# Patient Record
Sex: Female | Born: 1990 | Race: Black or African American | Hispanic: No | Marital: Single | Smoking: Current every day smoker
Health system: Southern US, Community
[De-identification: ages and names within clinical notes are randomized; demographics above are authoritative.]

## PROBLEM LIST (undated history)

## (undated) DIAGNOSIS — G44209 Tension-type headache, unspecified, not intractable: Secondary | ICD-10-CM

## (undated) DIAGNOSIS — J45909 Unspecified asthma, uncomplicated: Secondary | ICD-10-CM

## (undated) DIAGNOSIS — M797 Fibromyalgia: Secondary | ICD-10-CM

## (undated) DIAGNOSIS — IMO0002 Reserved for concepts with insufficient information to code with codable children: Secondary | ICD-10-CM

## (undated) DIAGNOSIS — G4700 Insomnia, unspecified: Secondary | ICD-10-CM

## (undated) DIAGNOSIS — M1990 Unspecified osteoarthritis, unspecified site: Secondary | ICD-10-CM

## (undated) HISTORY — PX: CHOLECYSTECTOMY: SHX55

## (undated) HISTORY — DX: Reserved for concepts with insufficient information to code with codable children: IMO0002

## (undated) HISTORY — DX: Unspecified asthma, uncomplicated: J45.909

---

## 2010-07-11 ENCOUNTER — Inpatient Hospital Stay (INDEPENDENT_AMBULATORY_CARE_PROVIDER_SITE_OTHER)
Admission: RE | Admit: 2010-07-11 | Discharge: 2010-07-11 | Disposition: A | Discharge status: Home / Self Care | Payer: PRIVATE HEALTH INSURANCE | Source: Ambulatory Visit | Attending: Family Medicine | Admitting: Family Medicine

## 2010-07-11 ENCOUNTER — Ambulatory Visit (INDEPENDENT_AMBULATORY_CARE_PROVIDER_SITE_OTHER): Payer: PRIVATE HEALTH INSURANCE

## 2010-07-11 DIAGNOSIS — S92919A Unspecified fracture of unspecified toe(s), initial encounter for closed fracture: Secondary | ICD-10-CM

## 2011-01-31 ENCOUNTER — Emergency Department (HOSPITAL_COMMUNITY)
Admission: EM | Admit: 2011-01-31 | Discharge: 2011-01-31 | Disposition: A | Discharge status: Home / Self Care | Payer: PRIVATE HEALTH INSURANCE | Attending: Emergency Medicine | Admitting: Emergency Medicine

## 2011-01-31 DIAGNOSIS — IMO0002 Reserved for concepts with insufficient information to code with codable children: Secondary | ICD-10-CM | POA: Insufficient documentation

## 2011-01-31 DIAGNOSIS — J45909 Unspecified asthma, uncomplicated: Secondary | ICD-10-CM | POA: Insufficient documentation

## 2011-01-31 DIAGNOSIS — M545 Low back pain, unspecified: Secondary | ICD-10-CM | POA: Insufficient documentation

## 2011-01-31 DIAGNOSIS — K219 Gastro-esophageal reflux disease without esophagitis: Secondary | ICD-10-CM | POA: Insufficient documentation

## 2011-01-31 DIAGNOSIS — M79609 Pain in unspecified limb: Secondary | ICD-10-CM | POA: Insufficient documentation

## 2011-12-25 ENCOUNTER — Emergency Department (INDEPENDENT_AMBULATORY_CARE_PROVIDER_SITE_OTHER)
Admission: EM | Admit: 2011-12-25 | Discharge: 2011-12-25 | Disposition: A | Discharge status: Home / Self Care | Payer: BC Managed Care – PPO | Source: Home / Self Care

## 2011-12-25 ENCOUNTER — Encounter (HOSPITAL_COMMUNITY): Payer: Self-pay | Admitting: *Deleted

## 2011-12-25 DIAGNOSIS — B359 Dermatophytosis, unspecified: Secondary | ICD-10-CM

## 2011-12-25 HISTORY — DX: Tension-type headache, unspecified, not intractable: G44.209

## 2011-12-25 HISTORY — DX: Insomnia, unspecified: G47.00

## 2011-12-25 MED ORDER — ITRACONAZOLE 200 MG PO TABS: 200.0000 mg | ORAL_TABLET | Freq: Every morning | ORAL | Status: DC

## 2011-12-25 NOTE — ED Notes (Signed)
Several areas of dry round rash history ring worm - per pt saw first few spots x 2 weeks ago using otc clotrimazole antifungal cream without relief

## 2011-12-25 NOTE — ED Provider Notes (Signed)
History     CSN: [id number]  Arrival date & time 12/25/11  1430   First MD Initiated Contact with Patient 12/25/11 1526      Chief Complaint  Patient presents with  . Rash   Patient is a 21 y.o. female presenting with rash. The history is provided by the patient.  Rash  This is a new problem. The current episode started more than 1 week ago.   21 yr old female presents with Ringworm infestation.  SHe thoguht that she caught them fairly early on her R and L breasts and they are now on her face as well, and on her buttcoks as well as on her arms.  She is not immunocompromised and has some areas in her hair and neck as well. She state sthis has happened ove rthe past 1-2 weeks after being in a swimming pool She is homosexual but is monogamous and uses protection  Past Medical History  Diagnosis Date  . Insomnia   . Tension headache     History reviewed. No pertinent past surgical history.  History reviewed. No pertinent family history.  History  Substance Use Topics  . Smoking status: Current Everyday Smoker    Types: Cigars  . Smokeless tobacco: Not on file  . Alcohol Use: Yes    OB History    Grav Para Term Preterm Abortions TAB SAB Ect Mult Living                  Review of Systems  Constitutional: Negative.   HENT: Negative.   Eyes: Negative.   Respiratory: Negative.   Cardiovascular: Negative.   Gastrointestinal: Negative.   Genitourinary: Negative.   Musculoskeletal: Negative.   Skin: Positive for rash.  Neurological: Negative.   Hematological: Negative.   Psychiatric/Behavioral: Negative.     Allergies  Amoxicillin  Home Medications   Current Outpatient Rx  Name Route Sig Dispense Refill  . CLOTRIMAZOLE 1 % EX CREA Topical Apply topically 2 (two) times daily.    Marland Kitchen GABAPENTIN 300 MG PO CAPS Oral Take 300 mg by mouth 2 (two) times daily at 10 AM and 5 PM.    . AMITRIPTYLINE HCL 10 MG PO TABS Oral Take 10 mg by mouth at bedtime.      BP 139/88   Pulse 86  Temp 98.5 F (36.9 C) (Oral)  Resp 18  SpO2 100%  LMP 12/13/2011  Physical Exam Obese AAF in NAd Various tenia over L face, adjacent to mouth, on L neck on top or L breast, on L arm and R torso and L upper thigh s1 s2 no m/r/g Motor 5/5   ED Course  Procedures (including critical care time)  Labs Reviewed - No data to display No results found.   No diagnosis found.    MDM  21 yr aaf with diffuse dermatophytosis Will start Itraconazole 200mg  qd x 14 daya.  Patient encouraged to take OTC benadryl to help with itching and to continue using Clotrimazole to face to aid resolution of these lesions  Pleas Koch, MD [facility] [phone]         Rhetta Mura, MD 12/25/11 2007

## 2012-10-15 ENCOUNTER — Emergency Department (HOSPITAL_COMMUNITY)
Admission: EM | Admit: 2012-10-15 | Discharge: 2012-10-15 | Disposition: A | Discharge status: Home / Self Care | Payer: Self-pay | Attending: Emergency Medicine | Admitting: Emergency Medicine

## 2012-10-15 ENCOUNTER — Encounter (HOSPITAL_COMMUNITY): Payer: Self-pay | Admitting: *Deleted

## 2012-10-15 DIAGNOSIS — Z87891 Personal history of nicotine dependence: Secondary | ICD-10-CM | POA: Insufficient documentation

## 2012-10-15 DIAGNOSIS — IMO0001 Reserved for inherently not codable concepts without codable children: Secondary | ICD-10-CM | POA: Insufficient documentation

## 2012-10-15 DIAGNOSIS — L02519 Cutaneous abscess of unspecified hand: Secondary | ICD-10-CM | POA: Insufficient documentation

## 2012-10-15 DIAGNOSIS — G44209 Tension-type headache, unspecified, not intractable: Secondary | ICD-10-CM | POA: Insufficient documentation

## 2012-10-15 DIAGNOSIS — L02512 Cutaneous abscess of left hand: Secondary | ICD-10-CM

## 2012-10-15 HISTORY — DX: Fibromyalgia: M79.7

## 2012-10-15 MED ORDER — LIDOCAINE-EPINEPHRINE-TETRACAINE (LET) SOLUTION
3.0000 mL | Freq: Once | NASAL | Status: AC
  Administered 2012-10-15: 3 mL via TOPICAL
  Filled 2012-10-15: qty 3

## 2012-10-15 MED ORDER — HYDROCODONE-ACETAMINOPHEN 5-325 MG PO TABS: 1.0000 | ORAL_TABLET | ORAL | Status: DC | PRN

## 2012-10-15 MED ORDER — CEPHALEXIN 500 MG PO CAPS: 500.0000 mg | ORAL_CAPSULE | Freq: Four times a day (QID) | ORAL | Status: DC

## 2012-10-15 MED ORDER — HYDROCODONE-ACETAMINOPHEN 5-325 MG PO TABS
1.0000 | ORAL_TABLET | Freq: Once | ORAL | Status: AC
  Administered 2012-10-15: 1 via ORAL
  Filled 2012-10-15: qty 1

## 2012-10-15 NOTE — ED Provider Notes (Signed)
History    This chart was scribed for non-physician practitioner, Arnoldo Hooker PA-C working with Glynn Octave, MD by Donne Anon, ED Scribe. This patient was seen in room TR05C/TR05C and the patient's care was started at 1854.   CSN: [id number]  Arrival date & time 10/15/12  1624   First MD Initiated Contact with Patient 10/15/12 1854      Chief Complaint  Patient presents with  . Abscess     The history is provided by the patient. No language interpreter was used.   HPI Comments: Nancy Dalton is a 22 y.o. female who presents to the Emergency Department complaining of 1 week of gradual onset, gradually worsening, constant, moderate abscess on her left hand. She reports associated swelling to her hand. She states she was seen by an Urgent Care facility last week that gave her diprolene .05% cream that has made the swelling worse. She though it was a spider bite but but did not see and insect. She has tried OTC pain medication with little relief. She denies fever or any other pain. She reports she is otherwise healthy.  Past Medical History  Diagnosis Date  . Insomnia   . Tension headache   . Fibromyalgia     History reviewed. No pertinent past surgical history.  No family history on file.  History  Substance Use Topics  . Smoking status: Former Smoker    Types: Cigars  . Smokeless tobacco: Not on file  . Alcohol Use: Yes     Review of Systems  Constitutional: Negative for fever.  HENT: Negative for neck pain.   Gastrointestinal: Negative for abdominal pain.  Musculoskeletal: Positive for joint swelling.  Skin: Positive for wound.    Allergies  Amoxicillin and Betamethasone  Home Medications   Current Outpatient Rx  Name  Route  Sig  Dispense  Refill  . betamethasone dipropionate (DIPROLENE) 0.05 % cream   Topical   Apply 1 application topically 2 (two) times daily.         Marland Kitchen ibuprofen (ADVIL,MOTRIN) 200 MG tablet   Oral   Take 1,000 mg by mouth  every 6 (six) hours as needed for pain.           BP 147/90  Pulse 88  Temp(Src) 98.1 F (36.7 C) (Oral)  Resp 18  SpO2 100%  Physical Exam  Nursing note and vitals reviewed. Constitutional: She is oriented to person, place, and time. She appears well-developed and well-nourished. No distress.  HENT:  Head: Normocephalic and atraumatic.  Eyes: EOM are normal.  Neck: Neck supple. No tracheal deviation present.  Cardiovascular: Normal rate.   Pulmonary/Chest: Effort normal. No respiratory distress.  Musculoskeletal: Normal range of motion.  Neurological: She is alert and oriented to person, place, and time.  Skin: Skin is warm and dry.  Left hand swollen dorsally with greatest swelling and redness just proximal to 5th MCP joint. FROM all joints of hand without difficulty. Finger soft to palpation - low suspicion for tenosynovitis. No redness extending proximally.  Psychiatric: She has a normal mood and affect. Her behavior is normal.    ED Course  Procedures (including critical care time) DIAGNOSTIC STUDIES: Oxygen Saturation is 100% on RA, normal by my interpretation.    COORDINATION OF CARE: 8:47 PM Discussed treatment plan which includes I&D and antibiotics with pt at bedside and pt agreed to plan. Advised pt to be rechecked in 2 days.  INCISION AND DRAINAGE Performed by: Arnoldo Hooker PA-C Consent: Verbal  consent obtained. Risks and benefits: risks, benefits and alternatives were discussed Type: abscess  Body area: dorsum of left hand proximal to 5th MCP joint  Anesthesia: local infiltration  Incision was made with a scalpel.  Local anesthetic: lidocaine 2% without epinephrine  Anesthetic total: 1 ml  Complexity: complex Blunt dissection to break up loculations  Drainage: moderate sebaceous material and puss  Drainage amount: moderate  Packing material: no packing used  Patient tolerance: Patient tolerated the procedure well with no immediate  complications.     Labs Reviewed - No data to display No results found.   No diagnosis found.  1. Cutaneous abscess  MDM  Uncomplicated hand abscess.     I personally performed the services described in this documentation, which was scribed in my presence. The recorded information has been reviewed and is accurate.       Arnoldo Hooker, PA-C 10/20/12 1337

## 2012-10-15 NOTE — ED Notes (Signed)
Pt is here with left hand abscess and has swelling.  Pt states this has been going on since last Tuesday and she put a cream on it and now it is worse than before

## 2012-10-17 ENCOUNTER — Emergency Department (HOSPITAL_COMMUNITY)
Admission: EM | Admit: 2012-10-17 | Discharge: 2012-10-17 | Disposition: A | Discharge status: Home / Self Care | Payer: BC Managed Care – PPO | Attending: Emergency Medicine | Admitting: Emergency Medicine

## 2012-10-17 ENCOUNTER — Encounter (HOSPITAL_COMMUNITY): Payer: Self-pay

## 2012-10-17 DIAGNOSIS — Z888 Allergy status to other drugs, medicaments and biological substances status: Secondary | ICD-10-CM | POA: Insufficient documentation

## 2012-10-17 DIAGNOSIS — Z87891 Personal history of nicotine dependence: Secondary | ICD-10-CM | POA: Insufficient documentation

## 2012-10-17 DIAGNOSIS — IMO0001 Reserved for inherently not codable concepts without codable children: Secondary | ICD-10-CM | POA: Insufficient documentation

## 2012-10-17 DIAGNOSIS — Z5189 Encounter for other specified aftercare: Secondary | ICD-10-CM

## 2012-10-17 DIAGNOSIS — Z882 Allergy status to sulfonamides status: Secondary | ICD-10-CM | POA: Insufficient documentation

## 2012-10-17 DIAGNOSIS — G4700 Insomnia, unspecified: Secondary | ICD-10-CM | POA: Insufficient documentation

## 2012-10-17 DIAGNOSIS — Z8679 Personal history of other diseases of the circulatory system: Secondary | ICD-10-CM | POA: Insufficient documentation

## 2012-10-17 DIAGNOSIS — Z4801 Encounter for change or removal of surgical wound dressing: Secondary | ICD-10-CM | POA: Insufficient documentation

## 2012-10-17 NOTE — Discharge Instructions (Signed)
Continue to take antibiotics.  Follow up next week for wound recheck after antibiotics are complete, sooner if symptoms worsening.

## 2012-10-17 NOTE — ED Notes (Signed)
Pt presents to ED to have an abscess that was drained here 2 days ago rechecked. Abscess is located on left hand. Small amount of drainage, pt denies pain at site.

## 2012-10-17 NOTE — ED Provider Notes (Signed)
History  This chart was scribed for non-physician practitioner, Hector Shade, working with Dione Booze, MD by Ardeen Jourdain, ED Scribe. This patient was seen in room TR04C/TR04C and the patient's care was started at 1529.  CSN: [id number]  Arrival date & time 10/17/12  1516   First MD Initiated Contact with Patient 10/17/12 1529      Chief Complaint  Patient presents with  . Follow-up     The history is provided by the patient. No language interpreter was used.    HPI Comments: Nancy Dalton is a 22 y.o. female who presents to the Emergency Department complaining of an abscess recheck. Pt states she was evaluated here at [facility] 2 days ago for an abscess on her left hand. She received an I&D as treatment for the abscess. She reports being prescribed Keflex and is taking it as prescribed. Pt denies any nausea, emesis, diarrhea, fever and pain at the site as associated symptoms. She reports using ice to treat the area. She denies using warm water compresses to treat the area.    Past Medical History  Diagnosis Date  . Insomnia   . Tension headache   . Fibromyalgia     History reviewed. No pertinent past surgical history.  No family history on file.  History  Substance Use Topics  . Smoking status: Former Smoker    Types: Cigars  . Smokeless tobacco: Not on file  . Alcohol Use: Yes   No OB history available.   Review of Systems  Skin: Positive for wound.       Abscess recheck  All other systems reviewed and are negative.    Allergies  Amoxicillin and Betamethasone  Home Medications   Current Outpatient Rx  Name  Route  Sig  Dispense  Refill  . cephALEXin (KEFLEX) 500 MG capsule   Oral   Take 1 capsule (500 mg total) by mouth 4 (four) times daily.   20 capsule   0   . HYDROcodone-acetaminophen (NORCO/VICODIN) 5-325 MG per tablet   Oral   Take 1-2 tablets by mouth every 4 (four) hours as needed for pain.   12 tablet   0   . ibuprofen  (ADVIL,MOTRIN) 200 MG tablet   Oral   Take 1,000 mg by mouth every 6 (six) hours as needed for pain.           Triage Vitals: BP 133/71  Pulse 92  Temp(Src) 98.1 F (36.7 C) (Oral)  SpO2 99%  LMP 10/13/2012  Physical Exam  Nursing note and vitals reviewed. Constitutional: She is oriented to person, place, and time. She appears well-developed and well-nourished. No distress.  HENT:  Head: Normocephalic and atraumatic.  Eyes: EOM are normal. Pupils are equal, round, and reactive to light.  Neck: Normal range of motion. Neck supple. No tracheal deviation present.  Cardiovascular: Normal rate, regular rhythm and normal heart sounds.  Exam reveals no gallop and no friction rub.   No murmur heard. Pulmonary/Chest: Effort normal and breath sounds normal. No respiratory distress. She has no wheezes. She has no rales. She exhibits no tenderness.  Abdominal: Soft. Bowel sounds are normal. She exhibits no distension. There is no tenderness.  Musculoskeletal: Normal range of motion. She exhibits no edema.  Neurological: She is alert and oriented to person, place, and time.  Skin: Skin is warm and dry. She is not diaphoretic.  Well healing open abscess to left hand with moderate amount serosanguineous drainage. Mild erythema. No warmth or red streaking.  Mild tenderness to area  Psychiatric: She has a normal mood and affect. Her behavior is normal.    ED Course  Procedures (including critical care time)  DIAGNOSTIC STUDIES: Oxygen Saturation is 99% on room air, normal by my interpretation.    COORDINATION OF CARE:  3:45 PM-Discussed treatment plan which includes instructions for home care with pt at bedside and pt agreed to plan.    Labs Reviewed - No data to display No results found.   1. Wound check, abscess       MDM  Pt being treated for abscess on left dorsum of hand, taking keflex presented for recheck.  Abscess appears to be healing well, still open and draining.  Mild  erythema, no warmth or red streaking.    Rx: advised pt to continue taking antibiotics as prescribed.  May take tylenol and ibuprofen for pain.  Use warm compresses several times a day to help abscess continue to drain.  Pt verbalized understanding and agreement with tx plan.  F/u in 2-3 days for recheck with PCP, urgent care, or ED if needed.  Vitals: unremarkable. Discharged in stable condition.     I personally performed the services described in this documentation, which was scribed in my presence. The recorded information has been reviewed and is accurate.   Junius Finner, PA-C 10/18/12 1533

## 2012-10-19 NOTE — ED Provider Notes (Signed)
Medical screening examination/treatment/procedure(s) were performed by non-physician practitioner and as supervising physician I was immediately available for consultation/collaboration.  Dione Booze, MD 10/19/12 [phone]

## 2012-10-20 NOTE — ED Provider Notes (Signed)
Medical screening examination/treatment/procedure(s) were performed by non-physician practitioner and as supervising physician I was immediately available for consultation/collaboration.    Glynn Octave, MD 10/20/12 [phone]

## 2013-01-17 ENCOUNTER — Emergency Department (HOSPITAL_COMMUNITY)
Admission: EM | Admit: 2013-01-17 | Discharge: 2013-01-17 | Disposition: A | Discharge status: Home / Self Care | Payer: Managed Care, Other (non HMO) | Attending: Emergency Medicine | Admitting: Emergency Medicine

## 2013-01-17 ENCOUNTER — Encounter (HOSPITAL_COMMUNITY): Payer: Self-pay | Admitting: Emergency Medicine

## 2013-01-17 DIAGNOSIS — Z8739 Personal history of other diseases of the musculoskeletal system and connective tissue: Secondary | ICD-10-CM | POA: Insufficient documentation

## 2013-01-17 DIAGNOSIS — Z8659 Personal history of other mental and behavioral disorders: Secondary | ICD-10-CM | POA: Insufficient documentation

## 2013-01-17 DIAGNOSIS — IMO0002 Reserved for concepts with insufficient information to code with codable children: Secondary | ICD-10-CM | POA: Insufficient documentation

## 2013-01-17 DIAGNOSIS — Z87891 Personal history of nicotine dependence: Secondary | ICD-10-CM | POA: Insufficient documentation

## 2013-01-17 DIAGNOSIS — L0291 Cutaneous abscess, unspecified: Secondary | ICD-10-CM

## 2013-01-17 NOTE — ED Provider Notes (Signed)
Medical screening examination/treatment/procedure(s) were performed by non-physician practitioner and as supervising physician I was immediately available for consultation/collaboration.   William Alucard Fearnow, MD 01/17/13 2052 

## 2013-01-17 NOTE — ED Notes (Signed)
Pt from home reports that she has had "large bumps" in her L armpit x 2 weeks. Pt reports that they "went away" and then came back. Pt denies N/V. Pt A&O and in NAD

## 2013-01-17 NOTE — ED Provider Notes (Signed)
CSN: [id number]     Arrival date & time 01/17/13  1758 History  This chart was scribed for non-physician practitioner, Johnnette Gourd, PA-C, working with Dagmar Hait, MD by Ronal Fear, ED scribe. This patient was seen in room WTR6/WTR6 and the patient's care was started at 6:24 PM.    Chief Complaint  Patient presents with  . Abscess    The history is provided by the patient. No language interpreter was used.    HPI Comments: Nancy Dalton is a 22 y.o. female who presents to the Emergency Department complaining of abcesses 2 weeks ago on the left axilla. Pt states the abscesses resolved on their own but returned 5 days ago due to stress which makes the abcesess worse. Pt states they are tender to touch with a pain rate of 2/10. Pt denies fever, chills, or discharge from the area. She has not tried any alleviating factors.    Past Medical History  Diagnosis Date  . Insomnia   . Tension headache   . Fibromyalgia    History reviewed. No pertinent past surgical history. No family history on file. History  Substance Use Topics  . Smoking status: Former Smoker    Types: Cigars  . Smokeless tobacco: Not on file  . Alcohol Use: Yes   OB History   Grav Para Term Preterm Abortions TAB SAB Ect Mult Living                 Review of Systems  Constitutional: Negative for fever and chills.  Skin:       abscess  All other systems reviewed and are negative.    Allergies  Amoxicillin; Betamethasone; and Fluocinonide  Home Medications  No current outpatient prescriptions on file. Triage Vitals: BP 133/79  Pulse 113  Temp(Src) 99.4 F (37.4 C) (Oral)  Resp 20  SpO2 100%  LMP 01/06/2013 Physical Exam  Nursing note and vitals reviewed. Constitutional: She is oriented to person, place, and time. She appears well-developed and well-nourished. No distress.  HENT:  Head: Normocephalic and atraumatic.  Mouth/Throat: Oropharynx is clear and moist.  Eyes: Conjunctivae and  EOM are normal.  Neck: Normal range of motion. Neck supple.  Cardiovascular: Normal rate, regular rhythm and normal heart sounds.   Pulmonary/Chest: Effort normal and breath sounds normal. No respiratory distress.  Musculoskeletal: Normal range of motion. She exhibits no edema.  Neurological: She is alert and oriented to person, place, and time. No sensory deficit.  Skin: Skin is warm and dry.  1 cm diameter area of induration. No fluctuance. No surrounding erythema, edema, or warmth. 5mm area of induration about 1 inch below.  No fluctuance. surrounding  erythema, edema or warmth.  Psychiatric: She has a normal mood and affect. Her behavior is normal.    ED Course  Procedures (including critical care time)  DIAGNOSTIC STUDIES: Oxygen Saturation is 100% on RA, normal by my interpretation.    COORDINATION OF CARE: 6:33 PM- Pt advised that abscess needs I&D but pt did not agree. She states she would like to wait to see if they go away on their own.      Labs Review Labs Reviewed - No data to display Imaging Review No results found.  MDM   1. Abscess    Patient with abscess without cellulitis underlying axilla. She refuses incision and drainage at this time. I advised her to use warm compresses. Close return precautions discussed including signs to look for for developing cellulitis. Patient states understanding of  plan and is agreeable.  I personally performed the services described in this documentation, which was scribed in my presence. The recorded information has been reviewed and is accurate.    Trevor Mace, PA-C 01/17/13 1850

## 2013-04-26 ENCOUNTER — Ambulatory Visit (INDEPENDENT_AMBULATORY_CARE_PROVIDER_SITE_OTHER): Payer: Managed Care, Other (non HMO) | Admitting: Family Medicine

## 2013-04-26 ENCOUNTER — Ambulatory Visit: Payer: Managed Care, Other (non HMO)

## 2013-04-26 ENCOUNTER — Other Ambulatory Visit: Payer: Self-pay | Admitting: Family Medicine

## 2013-04-26 VITALS — BP 112/84 | HR 130 | Temp 98.4°F | Resp 18 | Ht 62.75 in | Wt 232.0 lb

## 2013-04-26 DIAGNOSIS — R5381 Other malaise: Secondary | ICD-10-CM

## 2013-04-26 DIAGNOSIS — J45909 Unspecified asthma, uncomplicated: Secondary | ICD-10-CM

## 2013-04-26 DIAGNOSIS — R000 Tachycardia, unspecified: Secondary | ICD-10-CM

## 2013-04-26 DIAGNOSIS — M79609 Pain in unspecified limb: Secondary | ICD-10-CM

## 2013-04-26 DIAGNOSIS — R05 Cough: Secondary | ICD-10-CM

## 2013-04-26 DIAGNOSIS — M79641 Pain in right hand: Secondary | ICD-10-CM

## 2013-04-26 MED ORDER — ALBUTEROL SULFATE (2.5 MG/3ML) 0.083% IN NEBU
2.5000 mg | INHALATION_SOLUTION | Freq: Once | RESPIRATORY_TRACT | Status: AC
  Administered 2013-04-26: 2.5 mg via RESPIRATORY_TRACT

## 2013-04-26 MED ORDER — ALBUTEROL SULFATE HFA 108 (90 BASE) MCG/ACT IN AERS: 2.0000 | INHALATION_SPRAY | Freq: Four times a day (QID) | RESPIRATORY_TRACT | Status: AC | PRN

## 2013-04-26 MED ORDER — DOXYCYCLINE HYCLATE 100 MG PO TABS: 100.0000 mg | ORAL_TABLET | Freq: Two times a day (BID) | ORAL | Status: DC

## 2013-04-26 NOTE — Progress Notes (Signed)
[facility] [address] [place] [address] [phone]  Date:  04/26/2013   Name:  Nancy Dalton   DOB:  [birth year]   MRN:  [id number]  PCP:  No PCP Per Patient    Chief Complaint: Cough and URI   History of Present Illness:  Nancy Dalton is a 22 y.o. very pleasant female patient who presents with the following:  Here today with illness.  3 days ago she noted a stuffy nose and sinus congestion.   She also has a runny nose, sneezing, cough and wheezing.  Today the cough got worse, she noted that she was having more wheezing, difficulty breathing and chest pains.   The cough is non- productive. She is not sure if she might have had a fever.    She took some benadryl yesterday.  Today she took some allegra.  No antipyretics; she is not supposed to take these due to her history of headaches.  Per her neurologist she should only take pain medications that are rx to her.   She also notes a pain in her right hand and wrist for about one week.  She is not aware of any injury to her hand.  She is right handed  LMP 04/23/13.   She does have history of asthma.  She may have wheezing every week or so; she has this more with exercise but does not have an inhaler on hand right now.  Her friend who is with her states that her wheezing may be more significant than she is letting on; "you need to have an inhaler"   There are no active problems to display for this patient.   Past Medical History  Diagnosis Date  . Insomnia   . Tension headache   . Fibromyalgia   . Asthma   . Ulcer     No past surgical history on file.  History  Substance Use Topics  . Smoking status: Former Smoker    Types: Cigars  . Smokeless tobacco: Not on file  . Alcohol Use: Yes    Family History  Problem Relation Age of Onset  . Heart disease Mother   . Hypertension Father   . Cancer Sister     Allergies  Allergen Reactions  . Amoxicillin   . Betamethasone Swelling  .  Fluocinonide Rash    Medication list has been reviewed and updated.  No current outpatient prescriptions on file prior to visit.   No current facility-administered medications on file prior to visit.    Review of Systems:  As per HPI- otherwise negative.   Physical Examination: Filed Vitals:   04/26/13 1736  BP: 112/84  Pulse: 124  Temp: 98.4 F (36.9 C)  Resp: 18   Filed Vitals:   04/26/13 1736  Height: 5' 2.75" (1.594 m)  Weight: 232 lb (105.235 kg)   Body mass index is 41.42 kg/(m^2). Ideal Body Weight: Weight in (lb) to have BMI = 25: 139.7  GEN: WDWN, NAD, Non-toxic, A & O x 3, obese, pleasant HEENT: Atraumatic, Normocephalic. Neck supple. No masses, No LAD.  Bilateral TM wnl, oropharynx normal.  PEERL,EOMI.   Ears and Nose: No external deformity. CV: RRR, No M/G/R. No JVD. No thrill. No extra heart sounds. PULM: CTA B, no crackles, rhonchi. No retractions. No resp. distress. No accessory muscle use. Wheezing and somewhat poor air movement on initial exam ABD: S, NT, ND, +BS. No rebound. No HSM. EXTR: No c/c/e NEURO Normal gait.  PSYCH: Normally interactive. Conversant. Not depressed or anxious appearing.  Calm demeanor.  Right hand: she is slightly tender over the proximal 4th and 5th MCs.  No redness, swelling or heat.   Results for orders placed in visit on 04/26/13  POCT INFLUENZA A/B      Result Value Range   Influenza A, POC Negative     Influenza B, POC Negative     No history of DVT or PE.  No OCP, no hormones, no smoking.  No calf pain or swelling  Given albuterol neb:  This did help her sx remit.  She noted that her CP and tightness/ SOB were resolved. However her pulse remained at around 130 BPM.  She denied any distress or difficulty breathing at this point.   [facility] reading (PRIMARY) by  Dr. Patsy Lager. CXR: negative  Assessment and Plan: Cough - Plan: POCT Influenza A/B, albuterol (PROVENTIL) (2.5 MG/3ML) 0.083% nebulizer solution 2.5 mg,  albuterol (PROVENTIL HFA;VENTOLIN HFA) 108 (90 BASE) MCG/ACT inhaler, doxycycline (VIBRA-TABS) 100 MG tablet, DG Chest 2 View  Morbid obesity  Other malaise and fatigue  Tachycardia - Plan: D-dimer, quantitative, POCT CBC  Pain of right hand  Asthma  Nancy Dalton is here today with likely URI/ bronchitis causing exacerbation of her RAD.  Per her report she has been told that her pulse was fast in the past.  We discussed concern of possible PE causing her sx and tachycardia.  She is wiling to have a D dimer drawn today.  Await this result and start doxy and albuterol HFA in the meantime.    Called at 9:45 pm to let her know that her d dimer is negative.  She is relieved to hear this news. Plan to call and check on her in the next 1 or 2 days.     Results for orders placed in visit on 04/26/13  D-DIMER, QUANTITATIVE      Result Value Range   D-Dimer, Quant 0.39  0.00 - 0.48 ug/mL-FEU  POCT INFLUENZA A/B      Result Value Range   Influenza A, POC Negative     Influenza B, POC Negative       Signed Abbe Amsterdam, MD

## 2013-04-26 NOTE — Patient Instructions (Addendum)
Use the albuterol as needed and the doxycycline as directed.  If you are not feeling better in the next few days please let me know- Sooner if worse.   If you become short of breath and your symptoms do not resolve with your albuterol please seek care right away  I will give you a call with your D dimer result this evening

## 2013-04-28 ENCOUNTER — Telehealth: Payer: Self-pay | Admitting: Family Medicine

## 2013-04-28 NOTE — Telephone Encounter (Signed)
Called to check on her- she "feels so much better"

## 2013-04-29 ENCOUNTER — Telehealth: Payer: Self-pay

## 2013-04-29 DIAGNOSIS — R05 Cough: Secondary | ICD-10-CM

## 2013-04-29 NOTE — Telephone Encounter (Signed)
Patient is experiencing headaches possibly from Doxycycline. Could she have a different medication? [facility]  [phone]

## 2013-04-30 ENCOUNTER — Telehealth: Payer: Self-pay

## 2013-04-30 LAB — BASIC METABOLIC PANEL WITH GFR
CO2: 26 mEq/L (ref 19–32)
Glucose, Bld: 86 mg/dL (ref 70–99)
Potassium: 4.1 mEq/L (ref 3.5–5.3)
Sodium: 140 mEq/L (ref 135–145)

## 2013-04-30 MED ORDER — AZITHROMYCIN 250 MG PO TABS: ORAL_TABLET | ORAL | Status: DC

## 2013-04-30 NOTE — Telephone Encounter (Signed)
Message copied by Johnnette Litter on Wed Apr 30, 2013  2:58 PM ------      Message from: Abbe Amsterdam C      Created: Wed Apr 30, 2013  2:02 PM       Please add a BMP to the blood at [facility]/  Dx: cough ------

## 2013-04-30 NOTE — Telephone Encounter (Signed)
Unable to add. Only a D-Dimer was sent to [facility] and it's a blue top critical frozen

## 2013-04-30 NOTE — Telephone Encounter (Signed)
Please advise 

## 2013-04-30 NOTE — Telephone Encounter (Signed)
Called her back to get more information.  She has noted headache since she started her doxy.  She stopped taking this yesterday and her HA is now gone.   Will change to azithromycin Check BMP Otherwise her breathing is "so much better"

## 2013-04-30 NOTE — Telephone Encounter (Signed)
A SST was sent the other night with the D Dimer for [facility] to hold. Spoke with Annice Pih at [place]. BMP added.

## 2013-07-01 ENCOUNTER — Ambulatory Visit: Payer: Managed Care, Other (non HMO)

## 2014-05-22 HISTORY — PX: CHOLECYSTECTOMY: SHX55

## 2016-04-09 ENCOUNTER — Emergency Department (HOSPITAL_COMMUNITY)
Admission: EM | Admit: 2016-04-09 | Discharge: 2016-04-10 | Disposition: A | Discharge status: Home / Self Care | Payer: Self-pay | Attending: Emergency Medicine | Admitting: Emergency Medicine

## 2016-04-09 ENCOUNTER — Encounter (HOSPITAL_COMMUNITY): Payer: Self-pay | Admitting: Emergency Medicine

## 2016-04-09 ENCOUNTER — Emergency Department (HOSPITAL_COMMUNITY): Payer: Self-pay

## 2016-04-09 DIAGNOSIS — Z87891 Personal history of nicotine dependence: Secondary | ICD-10-CM | POA: Insufficient documentation

## 2016-04-09 DIAGNOSIS — R102 Pelvic and perineal pain unspecified side: Secondary | ICD-10-CM

## 2016-04-09 DIAGNOSIS — N730 Acute parametritis and pelvic cellulitis: Secondary | ICD-10-CM

## 2016-04-09 DIAGNOSIS — N739 Female pelvic inflammatory disease, unspecified: Secondary | ICD-10-CM | POA: Insufficient documentation

## 2016-04-09 DIAGNOSIS — Z79899 Other long term (current) drug therapy: Secondary | ICD-10-CM | POA: Insufficient documentation

## 2016-04-09 DIAGNOSIS — N921 Excessive and frequent menstruation with irregular cycle: Secondary | ICD-10-CM

## 2016-04-09 DIAGNOSIS — J45909 Unspecified asthma, uncomplicated: Secondary | ICD-10-CM | POA: Insufficient documentation

## 2016-04-09 DIAGNOSIS — N946 Dysmenorrhea, unspecified: Secondary | ICD-10-CM

## 2016-04-09 LAB — WET PREP, GENITAL
Clue Cells Wet Prep HPF POC: NONE SEEN
Sperm: NONE SEEN
Trich, Wet Prep: NONE SEEN
Yeast Wet Prep HPF POC: NONE SEEN

## 2016-04-09 LAB — URINE MICROSCOPIC-ADD ON

## 2016-04-09 LAB — BASIC METABOLIC PANEL
Anion gap: 8 (ref 5–15)
BUN: 7 mg/dL (ref 6–20)
CO2: 22 mmol/L (ref 22–32)
Calcium: 8.9 mg/dL (ref 8.9–10.3)
Chloride: 106 mmol/L (ref 101–111)
Creatinine, Ser: 0.77 mg/dL (ref 0.44–1.00)
GFR calc Af Amer: >60 mL/min (ref >60)
GFR calc non Af Amer: >60 mL/min (ref >60)
Glucose, Bld: 79 mg/dL (ref 65–99)
Potassium: 3.5 mmol/L (ref 3.5–5.1)
Sodium: 136 mmol/L (ref 135–145)

## 2016-04-09 LAB — URINALYSIS, ROUTINE W REFLEX MICROSCOPIC
Bilirubin Urine: NEGATIVE
Glucose, UA: NEGATIVE mg/dL
Ketones, ur: NEGATIVE mg/dL
Leukocytes, UA: NEGATIVE
Nitrite: NEGATIVE
Protein, ur: NEGATIVE mg/dL
Specific Gravity, Urine: 1.024 (ref 1.005–1.030)
pH: 5.5 (ref 5.0–8.0)

## 2016-04-09 LAB — CBC
HCT: 38.4 % (ref 36.0–46.0)
Hemoglobin: 12.9 g/dL (ref 12.0–15.0)
MCH: 29.1 pg (ref 26.0–34.0)
MCHC: 33.6 g/dL (ref 30.0–36.0)
MCV: 86.5 fL (ref 78.0–100.0)
Platelets: 298 10*3/uL (ref 150–400)
RBC: 4.44 MIL/uL (ref 3.87–5.11)
RDW: 12.6 % (ref 11.5–15.5)
WBC: 6.2 10*3/uL (ref 4.0–10.5)

## 2016-04-09 LAB — PREGNANCY, URINE: Preg Test, Ur: NEGATIVE

## 2016-04-09 MED ORDER — KETOROLAC TROMETHAMINE 60 MG/2ML IM SOLN
60.0000 mg | Freq: Once | INTRAMUSCULAR | Status: AC
  Administered 2016-04-09: 60 mg via INTRAMUSCULAR
  Filled 2016-04-09: qty 2

## 2016-04-09 MED ORDER — KETOROLAC TROMETHAMINE 30 MG/ML IJ SOLN: 30.0000 mg | Freq: Once | INTRAMUSCULAR | Status: DC

## 2016-04-09 MED ORDER — OXYCODONE-ACETAMINOPHEN 5-325 MG PO TABS
2.0000 | ORAL_TABLET | Freq: Once | ORAL | Status: AC
  Administered 2016-04-09: 2 via ORAL
  Filled 2016-04-09: qty 2

## 2016-04-09 MED ORDER — HYDROMORPHONE HCL 1 MG/ML IJ SOLN
1.0000 mg | Freq: Once | INTRAMUSCULAR | Status: AC
Start: 2016-04-09 — End: 2016-04-09
  Administered 2016-04-09: 1 mg via INTRAVENOUS
  Filled 2016-04-09: qty 1

## 2016-04-09 MED ORDER — ONDANSETRON HCL 4 MG/2ML IJ SOLN
4.0000 mg | Freq: Once | INTRAMUSCULAR | Status: AC
  Administered 2016-04-09: 4 mg via INTRAVENOUS
  Filled 2016-04-09: qty 2

## 2016-04-09 NOTE — ED Triage Notes (Signed)
Pt reports having lower abd pain for the last few days. Pt reports having period 3 times in the last month all lasting 5-7 days. Pt states she has a history of ovarian cysts. Currently denies any different vaginal discharge at this time.

## 2016-04-09 NOTE — ED Provider Notes (Signed)
[facility]-EMERGENCY DEPT Provider Note   CSN: [id number] Arrival date & time: 04/09/16  1952 By signing my name below, I, Levon HedgerElizabeth Hall, attest that this documentation has been prepared under the direction and in the presence of non-physician practitioner, Antony MaduraKelly Hedaya Latendresse, PA-C Electronically Signed: Levon HedgerElizabeth Hall, Scribe. 04/09/2016. 8:24 PM.    History   Chief Complaint Chief Complaint  Patient presents with  . Abdominal Pain   HPI Nancy Dalton is a 25 y.o. female who presents to the Emergency Department complaining of constant, severe RLQ abdominal pain which began a few days ago. She describes her pain as sharp and pressure-like. Pain is exacerbated by sitting, standing or movement; she has used a heating pad with some relief. No OTC medications taken due to hx of tension headaches. She notes associated abnormal vaginal bleeding and nausea. Pt states she has had her period every two weeks for the last month, each time lasting 5-7 days and with a heavier flow. She is currently on her period. Pt has abdominal SHx of cholecystectomy. Pt reports one sexual partner; female.  No concern for STD. She is not currently taking birth control. Pt denies vomiting, dysuria, hematuria, hematochezia, or fever.   The history is provided by the patient. No language interpreter was used.   Past Medical History:  Diagnosis Date  . Asthma   . Fibromyalgia   . Insomnia   . Tension headache   . Ulcer [facility](HCC)     Patient Active Problem List   Diagnosis Date Noted  . Morbid obesity (HCC) 04/26/2013  . Asthma 04/26/2013    Past Surgical History:  Procedure Laterality Date  . CHOLECYSTECTOMY      OB History    No data available     Home Medications    Prior to Admission medications   Medication Sig Start Date End Date Taking? Authorizing Provider  albuterol (PROVENTIL HFA;VENTOLIN HFA) 108 (90 BASE) MCG/ACT inhaler Inhale 2 puffs into the lungs every 6 (six) hours as needed for wheezing or  shortness of breath. 04/26/13  Yes Gwenlyn FoundJessica C Copland, MD  hydrOXYzine (ATARAX/VISTARIL) 10 MG tablet Take 20 mg by mouth daily.   Yes Historical Provider, MD  doxycycline (VIBRAMYCIN) 100 MG capsule Take 1 capsule (100 mg total) by mouth 2 (two) times daily. 04/10/16   Antony MaduraKelly Tynleigh Birt, PA-C  naproxen (NAPROSYN) 500 MG tablet Take 1 tablet (500 mg total) by mouth 2 (two) times daily. 04/10/16   Antony MaduraKelly Verne Lanuza, PA-C  traMADol (ULTRAM) 50 MG tablet Take 1 tablet (50 mg total) by mouth every 6 (six) hours as needed. 04/10/16   Antony MaduraKelly Alfio Loescher, PA-C    Family History Family History  Problem Relation Age of Onset  . Heart disease Mother   . Hypertension Father   . Cancer Sister    Social History Social History  Substance Use Topics  . Smoking status: Former Smoker    Types: Cigars  . Smokeless tobacco: Never Used  . Alcohol use Yes    Allergies   Pineapple; Ativan [lorazepam]; Betamethasone; Amoxicillin; and Fluocinonide  Review of Systems Review of Systems 10 systems reviewed and all are negative for acute change except as noted in the HPI.   Physical Exam Updated Vital Signs BP 136/71 (BP Location: Left Arm)   Pulse 95   Temp 98 F (36.7 C) (Oral)   Resp 14   Ht 5\' 3"  (1.6 m)   Wt 102.1 kg   LMP 04/09/2016 Comment: negative urine pregnancy test 04/09/16  SpO2 100%  BMI 39.86 kg/m   Physical Exam  Constitutional: She is oriented to person, place, and time. She appears well-developed and well-nourished. No distress.  Nontoxic appearing and in no distress  HENT:  Head: Normocephalic and atraumatic.  Eyes: Conjunctivae and EOM are normal. No scleral icterus.  Neck: Normal range of motion.  Cardiovascular: Normal rate, regular rhythm and intact distal pulses.   Pulmonary/Chest: Effort normal. No respiratory distress. She has no wheezes. She has no rales.  Abdominal: Soft. She exhibits no distension and no mass. There is tenderness. There is no guarding.  Suprapubic tenderness to  palpation. No peritoneal signs. Abdomen soft and obese. No masses palpable.  Genitourinary: There is no rash, tenderness, lesion or injury on the right labia. There is no rash, tenderness, lesion or injury on the left labia. Uterus is tender (mild). Cervix exhibits no motion tenderness and no friability. Right adnexum displays tenderness. Right adnexum displays no mass. Left adnexum displays no mass and no tenderness. There is bleeding (scant) in the vagina. No signs of injury around the vagina. No vaginal discharge found.  Musculoskeletal: Normal range of motion.  Neurological: She is alert and oriented to person, place, and time. She exhibits normal muscle tone. Coordination normal.  Skin: Skin is warm and dry. No rash noted. She is not diaphoretic. No erythema. No pallor.  Psychiatric: She has a normal mood and affect. Her behavior is normal.  Nursing note and vitals reviewed.   ED Treatments / Results  DIAGNOSTIC STUDIES:  Oxygen Saturation is 100% on RA, normal by my interpretation.    COORDINATION OF CARE:  8:24 PM  Discussed treatment plan which includes pelvic exam with pt at bedside and pt agreed to plan.   Labs (all labs ordered are listed, but only abnormal results are displayed) Labs Reviewed  WET PREP, GENITAL - Abnormal; Notable for the following:       Result Value   WBC, Wet Prep HPF POC MANY (*)    All other components within normal limits  URINALYSIS, ROUTINE W REFLEX MICROSCOPIC (NOT AT [facility]) - Abnormal; Notable for the following:    Hgb urine dipstick TRACE (*)    All other components within normal limits  URINE MICROSCOPIC-ADD ON - Abnormal; Notable for the following:    Squamous Epithelial / LPF 0-5 (*)    Bacteria, UA RARE (*)    All other components within normal limits  CBC  BASIC METABOLIC PANEL  PREGNANCY, URINE  GC/CHLAMYDIA PROBE AMP ([place]) NOT AT [facility]    EKG  EKG Interpretation None      Radiology US Pelvis Complete  Result Date:  04/09/2016 CLINICAL DATA:  Pelvic pain for 3 days. Menometrorrhagia. Patient refused transvaginal imaging. EXAM: TRANSABDOMINAL ULTRASOUND OF PELVIS TECHNIQUE: Transabdominal ultrasound examination of the pelvis was performed including evaluation of the uterus, ovaries, adnexal regions, and pelvic cul-de-sac. COMPARISON:  CT abdomen and pelvis 03/23/2016 FINDINGS: Uterus Measurements: 7.7 x 3.7 x 4.7 cm. Uterus is slightly retroverted. No fibroids or other mass visualized. Endometrium Thickness: 6.2 mm.  No focal abnormality visualized. Right ovary Measurements: 2.6 x 2.1 x 2 cm. Normal appearance/no adnexal mass. Left ovary Measurements: 2.8 x 1.6 x 1.9 cm. Normal appearance/no adnexal mass. Other findings:  No abnormal free fluid. IMPRESSION: Normal ultrasound appearance of the uterus and ovaries. Electronically Signed   By: Burman Nieves M.D.   On: 04/09/2016 21:57   Ct Abdomen Pelvis W Contrast  Result Date: 04/10/2016 CLINICAL DATA:  Severe right lower quadrant  pain for a few days. Abnormal vaginal bleeding and nausea. White cell count 6.2. Negative urine pregnancy test. EXAM: CT ABDOMEN AND PELVIS WITH CONTRAST TECHNIQUE: Multidetector CT imaging of the abdomen and pelvis was performed using the standard protocol following bolus administration of intravenous contrast. CONTRAST:  ISOVUE-300 IOPAMIDOL (ISOVUE-300) INJECTION 61% COMPARISON:  03/23/2016 FINDINGS: Lower chest: Mild dependent changes in the lung bases. Hepatobiliary: Normal homogeneous appearance of the liver. Gallbladder is not visualized and could be surgically absent or contracted. No bile duct dilatation. Pancreas: Unremarkable. No pancreatic ductal dilatation or surrounding inflammatory changes. Spleen: Normal in size without focal abnormality. Adrenals/Urinary Tract: Adrenal glands are unremarkable. Kidneys are normal, without renal calculi, focal lesion, or hydronephrosis. Bladder is unremarkable. Stomach/Bowel: Stomach is  within normal limits. Appendix appears normal. No evidence of bowel wall thickening, distention, or inflammatory changes. Vascular/Lymphatic: No significant vascular findings are present. No enlarged abdominal or pelvic lymph nodes. Reproductive: Uterus and bilateral adnexa are unremarkable. Other: No abdominal wall hernia or abnormality. No abdominopelvic ascites. Musculoskeletal: No acute or significant osseous findings. IMPRESSION: No acute process demonstrated in the abdomen or pelvis. No evidence of bowel obstruction or inflammation. The appendix is normal. Electronically Signed   By: Burman Nieves M.D.   On: 04/10/2016 00:35    Procedures Procedures (including critical care time)  Medications Ordered in ED Medications  iopamidol (ISOVUE-300) 61 % injection (  Canceled Entry 04/10/16 0155)  oxyCODONE-acetaminophen (PERCOCET/ROXICET) 5-325 MG per tablet 2 tablet (2 tablets Oral Given 04/09/16 2108)  ketorolac (TORADOL) injection 60 mg (60 mg Intramuscular Given 04/09/16 2215)  ondansetron (ZOFRAN) injection 4 mg (4 mg Intravenous Given 04/09/16 2354)  HYDROmorphone (DILAUDID) injection 1 mg (1 mg Intravenous Given 04/09/16 2354)  iopamidol (ISOVUE-300) 61 % injection 100 mL (100 mLs Intravenous Contrast Given 04/10/16 0018)  diphenhydrAMINE (BENADRYL) injection 12.5 mg (12.5 mg Intravenous Given 04/10/16 0109)  cefTRIAXone (ROCEPHIN) injection 250 mg (250 mg Intramuscular Given 04/10/16 0153)  lidocaine (XYLOCAINE) 1 % (with pres) injection (20 mLs  Given 04/10/16 0154)     Initial Impression / Assessment and Plan / ED Course  I have reviewed the triage vital signs and the nursing notes.  Pertinent labs & imaging results that were available during my care of the patient were reviewed by me and considered in my medical decision making (see chart for details).  Clinical Course     25 year old female presents to the emergency department for evaluation of dysmenorrhea and  menometrorrhagia. Patient with tenderness to palpation in her suprapubic abdomen. Laboratory workup reassuring. Ultrasound imaging and CT scan also obtained. These are negative for acute findings. While patient has no fever, leukocytosis, or cervical motion tenderness, plan for treatment for pelvic inflammatory disease. Have also counseled on pain control with naproxen. Short course of tramadol given for severe pain. Patient referred to an OB/GYN for follow-up. I do not believe further emergent workup is indicated. Patient discharged in stable condition with no unaddressed concerns.   Final Clinical Impressions(s) / ED Diagnoses   Final diagnoses:  Dysmenorrhea  PID (acute pelvic inflammatory disease)    New Prescriptions Discharge Medication List as of 04/10/2016  1:18 AM    START taking these medications   Details  doxycycline (VIBRAMYCIN) 100 MG capsule Take 1 capsule (100 mg total) by mouth 2 (two) times daily., Starting Mon 04/10/2016, Print    naproxen (NAPROSYN) 500 MG tablet Take 1 tablet (500 mg total) by mouth 2 (two) times daily., Starting Mon 04/10/2016, Print  traMADol (ULTRAM) 50 MG tablet Take 1 tablet (50 mg total) by mouth every 6 (six) hours as needed., Starting Mon 04/10/2016, Print        I personally performed the services described in this documentation, which was scribed in my presence. The recorded information has been reviewed and is accurate.      Antony MaduraKelly Moriyah Byington, PA-C 04/10/16 [id number]    Shaune Pollackameron Isaacs, MD 04/10/16 [phone]

## 2016-04-10 ENCOUNTER — Encounter (HOSPITAL_COMMUNITY): Payer: Self-pay

## 2016-04-10 ENCOUNTER — Emergency Department (HOSPITAL_COMMUNITY): Payer: Self-pay

## 2016-04-10 LAB — GC/CHLAMYDIA PROBE AMP (~~LOC~~) NOT AT ARMC
CHLAMYDIA, DNA PROBE: NEGATIVE
NEISSERIA GONORRHEA: NEGATIVE

## 2016-04-10 MED ORDER — LIDOCAINE HCL 1 % IJ SOLN
INTRAMUSCULAR | Status: AC
  Administered 2016-04-10: 20 mL
  Filled 2016-04-10: qty 20

## 2016-04-10 MED ORDER — IOPAMIDOL (ISOVUE-300) INJECTION 61%
100.0000 mL | Freq: Once | INTRAVENOUS | Status: AC | PRN
  Administered 2016-04-10: 100 mL via INTRAVENOUS

## 2016-04-10 MED ORDER — CEFTRIAXONE SODIUM 250 MG IJ SOLR
250.0000 mg | Freq: Once | INTRAMUSCULAR | Status: AC
  Administered 2016-04-10: 250 mg via INTRAMUSCULAR
  Filled 2016-04-10: qty 250

## 2016-04-10 MED ORDER — DIPHENHYDRAMINE HCL 50 MG/ML IJ SOLN
12.5000 mg | Freq: Once | INTRAMUSCULAR | Status: AC
  Administered 2016-04-10: 12.5 mg via INTRAVENOUS
  Filled 2016-04-10: qty 1

## 2016-04-10 MED ORDER — DOXYCYCLINE HYCLATE 100 MG PO CAPS: 100.0000 mg | ORAL_CAPSULE | Freq: Two times a day (BID) | ORAL | 0 refills | Status: AC

## 2016-04-10 MED ORDER — NAPROXEN 500 MG PO TABS: 500.0000 mg | ORAL_TABLET | Freq: Two times a day (BID) | ORAL | 0 refills | Status: AC

## 2016-04-10 MED ORDER — TRAMADOL HCL 50 MG PO TABS: 50.0000 mg | ORAL_TABLET | Freq: Four times a day (QID) | ORAL | 0 refills | Status: AC | PRN

## 2016-04-10 MED ORDER — IOPAMIDOL (ISOVUE-300) INJECTION 61%
INTRAVENOUS | Status: AC
  Filled 2016-04-10: qty 100

## 2016-04-10 NOTE — Discharge Instructions (Signed)
You are being treated for pelvic inflammatory disease. Take doxycycline as prescribed until finished. Take naproxen as prescribed for pain. You may take tramadol for severe pain control. Follow-up with an OB/GYN for persistent symptoms. You may return for any new or concerning symptoms.

## 2016-04-26 ENCOUNTER — Encounter (HOSPITAL_COMMUNITY): Payer: Self-pay

## 2016-04-26 ENCOUNTER — Emergency Department (HOSPITAL_COMMUNITY)
Admission: EM | Admit: 2016-04-26 | Discharge: 2016-04-26 | Disposition: A | Discharge status: Home / Self Care | Payer: Managed Care, Other (non HMO) | Attending: Emergency Medicine | Admitting: Emergency Medicine

## 2016-04-26 ENCOUNTER — Emergency Department (HOSPITAL_COMMUNITY): Payer: Managed Care, Other (non HMO)

## 2016-04-26 DIAGNOSIS — F17290 Nicotine dependence, other tobacco product, uncomplicated: Secondary | ICD-10-CM | POA: Insufficient documentation

## 2016-04-26 DIAGNOSIS — Z79899 Other long term (current) drug therapy: Secondary | ICD-10-CM | POA: Insufficient documentation

## 2016-04-26 DIAGNOSIS — R1013 Epigastric pain: Secondary | ICD-10-CM | POA: Insufficient documentation

## 2016-04-26 DIAGNOSIS — R112 Nausea with vomiting, unspecified: Secondary | ICD-10-CM | POA: Insufficient documentation

## 2016-04-26 DIAGNOSIS — J45909 Unspecified asthma, uncomplicated: Secondary | ICD-10-CM | POA: Insufficient documentation

## 2016-04-26 LAB — URINALYSIS, ROUTINE W REFLEX MICROSCOPIC
BILIRUBIN URINE: NEGATIVE
GLUCOSE, UA: NEGATIVE mg/dL
HGB URINE DIPSTICK: NEGATIVE
Ketones, ur: 5 mg/dL — AB
Leukocytes, UA: NEGATIVE
Nitrite: NEGATIVE
PROTEIN: NEGATIVE mg/dL
Specific Gravity, Urine: 1.021 (ref 1.005–1.030)
pH: 5 (ref 5.0–8.0)

## 2016-04-26 LAB — COMPREHENSIVE METABOLIC PANEL
ALBUMIN: 4.6 g/dL (ref 3.5–5.0)
ALK PHOS: 81 U/L (ref 38–126)
ALT: 15 U/L (ref 14–54)
AST: 17 U/L (ref 15–41)
Anion gap: 9 (ref 5–15)
BILIRUBIN TOTAL: 0.8 mg/dL (ref 0.3–1.2)
BUN: 9 mg/dL (ref 6–20)
CALCIUM: 9.3 mg/dL (ref 8.9–10.3)
CO2: 23 mmol/L (ref 22–32)
CREATININE: 0.79 mg/dL (ref 0.44–1.00)
Chloride: 106 mmol/L (ref 101–111)
GFR calc Af Amer: >60 mL/min (ref >60)
GFR calc non Af Amer: >60 mL/min (ref >60)
GLUCOSE: 90 mg/dL (ref 65–99)
POTASSIUM: 3.8 mmol/L (ref 3.5–5.1)
Sodium: 138 mmol/L (ref 135–145)
TOTAL PROTEIN: 8 g/dL (ref 6.5–8.1)

## 2016-04-26 LAB — CBC
HEMATOCRIT: 39.8 % (ref 36.0–46.0)
HEMOGLOBIN: 13.9 g/dL (ref 12.0–15.0)
MCH: 29.1 pg (ref 26.0–34.0)
MCHC: 34.9 g/dL (ref 30.0–36.0)
MCV: 83.4 fL (ref 78.0–100.0)
Platelets: 304 10*3/uL (ref 150–400)
RBC: 4.77 MIL/uL (ref 3.87–5.11)
RDW: 12 % (ref 11.5–15.5)
WBC: 5.8 10*3/uL (ref 4.0–10.5)

## 2016-04-26 LAB — I-STAT BETA HCG BLOOD, ED (MC, WL, AP ONLY): I-stat hCG, quantitative: <5 m[IU]/mL (ref <5)

## 2016-04-26 LAB — I-STAT TROPONIN, ED: Troponin i, poc: 0 ng/mL (ref 0.00–0.08)

## 2016-04-26 LAB — LIPASE, BLOOD: Lipase: 25 U/L (ref 11–51)

## 2016-04-26 MED ORDER — ONDANSETRON HCL 4 MG/2ML IJ SOLN
4.0000 mg | Freq: Once | INTRAMUSCULAR | Status: AC
  Administered 2016-04-26: 4 mg via INTRAVENOUS
  Filled 2016-04-26: qty 2

## 2016-04-26 MED ORDER — FAMOTIDINE 20 MG PO TABS: 20.0000 mg | ORAL_TABLET | Freq: Two times a day (BID) | ORAL | 0 refills | Status: AC

## 2016-04-26 MED ORDER — SODIUM CHLORIDE 0.9 % IV BOLUS (SEPSIS)
1000.0000 mL | Freq: Once | INTRAVENOUS | Status: AC
  Administered 2016-04-26: 1000 mL via INTRAVENOUS

## 2016-04-26 MED ORDER — ONDANSETRON HCL 4 MG PO TABS: 4.0000 mg | ORAL_TABLET | Freq: Four times a day (QID) | ORAL | 0 refills | Status: AC

## 2016-04-26 MED ORDER — FAMOTIDINE IN NACL 20-0.9 MG/50ML-% IV SOLN
20.0000 mg | Freq: Once | INTRAVENOUS | Status: AC
  Administered 2016-04-26: 20 mg via INTRAVENOUS
  Filled 2016-04-26: qty 50

## 2016-04-26 MED ORDER — MORPHINE SULFATE (PF) 4 MG/ML IV SOLN
4.0000 mg | Freq: Once | INTRAVENOUS | Status: DC
  Filled 2016-04-26: qty 1

## 2016-04-26 MED ORDER — MORPHINE SULFATE (PF) 4 MG/ML IV SOLN
4.0000 mg | Freq: Once | INTRAVENOUS | Status: AC
  Administered 2016-04-26: 4 mg via INTRAVENOUS
  Filled 2016-04-26: qty 1

## 2016-04-26 NOTE — ED Triage Notes (Signed)
PT C/O UPPER ABDOMINAL PAIN WITH N/V/D OVER A WEEK. PT STS HER CHEST STARTED HURTING 2 DAYS AGO, WITH A DRY COUGH, WEAKNESS, AND SOB. PT ALSO STS CHILLS.

## 2016-04-26 NOTE — ED Notes (Signed)
EKG completed in triage.

## 2016-04-26 NOTE — ED Notes (Signed)
One attempted lab draw unsuccessful

## 2016-04-26 NOTE — ED Provider Notes (Signed)
[facility]-EMERGENCY DEPT Provider Note   CSN: [id number] Arrival date & time: 04/26/16  1434     History   Chief Complaint Chief Complaint  Patient presents with  . Abdominal Pain  . Chest Pain    HPI Nancy Dalton is a 25 y.o. female.  Pt presents to the ED today with epigastric and RUQ abdominal pain.  She staid that she has had n/v as well.  Pt has also had a cough and chest pain.  The pt denies any fevers, but she has felt hot.  Pt was here on 11/19 for the same.  She had an [place] and a CT scan that were normal.  Pt given naprosyn for her sx.  Gallbladder is gone.      Past Medical History:  Diagnosis Date  . Asthma   . Fibromyalgia   . Insomnia   . Tension headache   . Ulcer [facility])     Patient Active Problem List   Diagnosis Date Noted  . Morbid obesity (HCC) 04/26/2013  . Asthma 04/26/2013    Past Surgical History:  Procedure Laterality Date  . CHOLECYSTECTOMY      OB History    No data available       Home Medications    Prior to Admission medications   Medication Sig Start Date End Date Taking? Authorizing Provider  DM-Doxylamine-Acetaminophen (VICKS NYQUIL COLD & FLU) 15-6.25-325 MG CAPS Take 2 capsules by mouth at bedtime as needed (for cold and sleep).   Yes Historical Provider, MD  hydrOXYzine (ATARAX/VISTARIL) 10 MG tablet Take 20 mg by mouth daily.   Yes Historical Provider, MD  albuterol (PROVENTIL HFA;VENTOLIN HFA) 108 (90 BASE) MCG/ACT inhaler Inhale 2 puffs into the lungs every 6 (six) hours as needed for wheezing or shortness of breath. Patient not taking: Reported on 04/26/2016 04/26/13   Pearline Cables, MD  doxycycline (VIBRAMYCIN) 100 MG capsule Take 1 capsule (100 mg total) by mouth 2 (two) times daily. Patient not taking: Reported on 04/26/2016 04/10/16   Antony Madura, PA-C  famotidine (PEPCID) 20 MG tablet Take 1 tablet (20 mg total) by mouth 2 (two) times daily. 04/26/16   Jacalyn Lefevre, MD  naproxen (NAPROSYN) 500 MG tablet Take 1  tablet (500 mg total) by mouth 2 (two) times daily. Patient not taking: Reported on 04/26/2016 04/10/16   Antony Madura, PA-C  ondansetron (ZOFRAN) 4 MG tablet Take 1 tablet (4 mg total) by mouth every 6 (six) hours. 04/26/16   Jacalyn Lefevre, MD  traMADol (ULTRAM) 50 MG tablet Take 1 tablet (50 mg total) by mouth every 6 (six) hours as needed. Patient not taking: Reported on 04/26/2016 04/10/16   Antony Madura, PA-C    Family History Family History  Problem Relation Age of Onset  . Heart disease Mother   . Hypertension Father   . Cancer Sister     Social History Social History  Substance Use Topics  . Smoking status: Current Every Day Smoker    Packs/day: 0.50    Types: Cigars  . Smokeless tobacco: Never Used  . Alcohol use Yes     Allergies   Bee venom; Pineapple; Ativan [lorazepam]; Betamethasone; Amoxicillin; and Fluocinonide   Review of Systems Review of Systems  Respiratory: Positive for cough.   Cardiovascular: Positive for chest pain.  Gastrointestinal: Positive for abdominal pain, nausea and vomiting.  All other systems reviewed and are negative.    Physical Exam Updated Vital Signs BP 126/93 (BP Location: Left Arm)   Pulse  105   Temp 98.5 F (36.9 C)   Resp 18   Ht 5\' 3"  (1.6 m)   Wt 225 lb (102.1 kg)   LMP 04/09/2016 Comment: negative urine pregnancy test 04/09/16  SpO2 100%   BMI 39.86 kg/m   Physical Exam  Constitutional: She is oriented to person, place, and time. She appears well-developed and well-nourished.  HENT:  Head: Normocephalic and atraumatic.  Right Ear: External ear normal.  Left Ear: External ear normal.  Nose: Nose normal.  Mouth/Throat: Oropharynx is clear and moist.  Eyes: Conjunctivae and EOM are normal. Pupils are equal, round, and reactive to light.  Neck: Normal range of motion. Neck supple.  Cardiovascular: Regular rhythm, normal heart sounds and intact distal pulses.  Tachycardia present.   Pulmonary/Chest: Effort normal and  breath sounds normal.  Abdominal: Soft. Bowel sounds are normal. There is tenderness in the right upper quadrant and epigastric area.  Musculoskeletal: Normal range of motion.  Neurological: She is alert and oriented to person, place, and time.  Skin: Skin is warm.  Psychiatric: She has a normal mood and affect. Her behavior is normal. Thought content normal.  Nursing note and vitals reviewed.    ED Treatments / Results  Labs (all labs ordered are listed, but only abnormal results are displayed) Labs Reviewed  URINALYSIS, ROUTINE W REFLEX MICROSCOPIC - Abnormal; Notable for the following:       Result Value   APPearance HAZY (*)    Ketones, ur 5 (*)    All other components within normal limits  CBC  LIPASE, BLOOD  COMPREHENSIVE METABOLIC PANEL  I-STAT TROPOININ, ED  I-STAT BETA HCG BLOOD, ED ([facility], [facility], [facility] ONLY)    EKG  EKG Interpretation  Date/Time:  Wednesday April 26 2016 14:42:10 EST Ventricular Rate:  134 PR Interval:    QRS Duration: 77 QT Interval:  311 QTC Calculation: 465 R Axis:   64 Text Interpretation:  Sinus tachycardia Confirmed by Particia NearingHAVILAND MD, Agam Davenport ([id number]) on 04/26/2016 5:22:02 PM       Radiology Dg Chest 2 View  Result Date: 04/26/2016 CLINICAL DATA:  Cough.  Dizziness. EXAM: CHEST  2 VIEW COMPARISON:  04/26/2013 . FINDINGS: Mediastinum and hilar structures normal. Lungs are clear. No pleural effusion or pneumothorax. No acute bony abnormality. Chest is stable from prior study. IMPRESSION: No acute abnormality . Electronically Signed   By: Maisie Fushomas  Register   On: 04/26/2016 15:15    Procedures Procedures (including critical care time)  Medications Ordered in ED Medications  morphine 4 MG/ML injection 4 mg (4 mg Intravenous Not Given 04/26/16 2042)  sodium chloride 0.9 % bolus 1,000 mL (0 mLs Intravenous Stopped 04/26/16 1949)  ondansetron (ZOFRAN) injection 4 mg (4 mg Intravenous Given 04/26/16 1744)  morphine 4 MG/ML injection 4 mg (4 mg Intravenous  Given 04/26/16 1744)  famotidine (PEPCID) IVPB 20 mg premix (0 mg Intravenous Stopped 04/26/16 1831)  ondansetron (ZOFRAN) injection 4 mg (4 mg Intravenous Given 04/26/16 2042)     Initial Impression / Assessment and Plan / ED Course  I have reviewed the triage vital signs and the nursing notes.  Pertinent labs & imaging results that were available during my care of the patient were reviewed by me and considered in my medical decision making (see chart for details).  Clinical Course    Pt is feeling better.  She will be started on pepcid and zofran.  Final Clinical Impressions(s) / ED Diagnoses   Final diagnoses:  Epigastric pain  Non-intractable  vomiting with nausea, unspecified vomiting type    New Prescriptions Discharge Medication List as of 04/26/2016  6:45 PM    START taking these medications   Details  famotidine (PEPCID) 20 MG tablet Take 1 tablet (20 mg total) by mouth 2 (two) times daily., Starting Wed 04/26/2016, Print    ondansetron (ZOFRAN) 4 MG tablet Take 1 tablet (4 mg total) by mouth every 6 (six) hours., Starting Wed 04/26/2016, Print         Jacalyn LefevreJulie Taunya Goral, MD 04/26/16 2306

## 2016-07-11 ENCOUNTER — Other Ambulatory Visit: Payer: Self-pay | Admitting: Obstetrics

## 2016-07-12 ENCOUNTER — Ambulatory Visit
Admission: RE | Admit: 2016-07-12 | Discharge: 2016-07-12 | Disposition: A | Discharge status: Home / Self Care | Payer: Self-pay | Source: Ambulatory Visit | Attending: Obstetrics | Admitting: Obstetrics

## 2016-07-25 ENCOUNTER — Emergency Department
Admission: EM | Admit: 2016-07-25 | Discharge: 2016-07-25 | Disposition: A | Discharge status: Home / Self Care | Payer: Self-pay | Source: Ambulatory Visit | Attending: Emergency Medicine | Admitting: Emergency Medicine

## 2016-07-25 ENCOUNTER — Emergency Department: Payer: Self-pay

## 2016-07-25 DIAGNOSIS — S93402A Sprain of unspecified ligament of left ankle, initial encounter: Secondary | ICD-10-CM | POA: Insufficient documentation

## 2016-07-25 DIAGNOSIS — S99912A Unspecified injury of left ankle, initial encounter: Secondary | ICD-10-CM

## 2016-07-25 DIAGNOSIS — Y9389 Activity, other specified: Secondary | ICD-10-CM | POA: Insufficient documentation

## 2016-07-25 DIAGNOSIS — W109XXA Fall (on) (from) unspecified stairs and steps, initial encounter: Secondary | ICD-10-CM | POA: Insufficient documentation

## 2016-07-25 DIAGNOSIS — M25572 Pain in left ankle and joints of left foot: Secondary | ICD-10-CM

## 2016-07-25 DIAGNOSIS — Y9289 Other specified places as the place of occurrence of the external cause: Secondary | ICD-10-CM | POA: Insufficient documentation

## 2016-07-25 DIAGNOSIS — W108XXA Fall (on) (from) other stairs and steps, initial encounter: Secondary | ICD-10-CM

## 2016-07-25 DIAGNOSIS — Y998 Other external cause status: Secondary | ICD-10-CM | POA: Insufficient documentation

## 2016-07-25 MED ORDER — IBUPROFEN 800 MG PO TABS *I*
800.0000 mg | ORAL_TABLET | Freq: Once | ORAL | Status: DC
Start: 2016-07-25 — End: 2016-07-26
  Filled 2016-07-25: qty 1

## 2016-07-25 NOTE — ED Notes (Addendum)
Patient ambulatory for discharge. Vital signs stable and patient is safe for discharge.  Discharge teaching and follow up care reviewed. Patient verbalized understanding of discharge instructions. To follow up with provider. Proper clothing in place, crutches and ankle brace provided.

## 2016-07-25 NOTE — First Provider Contact (Signed)
ED First Provider Contact Note    Initial provider evaluation performed by   ED First Provider Contact     Date/Time Event User Comments    07/25/16 2116 ED First Provider Contact Necola Bluestein C Initial Face to Face Provider Contact        26 yo female presents with left ankle pain x 1 week after trip down a step. Now having pain radiating up entire left leg . Ambulatory. Works as a Financial trader.     Vital signs reviewed.    Orders placed:  XRAYS     Patient requires further evaluation.     Laurette Schimke Muriel Hannold, PA, 07/25/2016, 9:18 PM    Supervising physician Dr. Mellissa Kohut was immediately available     Deetta Perla, [place]  07/25/16 2119

## 2016-07-25 NOTE — ED Triage Notes (Signed)
Left ankle pain x one week after fall.        Triage Note   Hollace Hayward, RN

## 2016-07-25 NOTE — Discharge Instructions (Signed)
Ibuprofen 400 mg every 6 hr    Crutches as needed    Brace as needed    See your doctor for follow up If daily improvement not seen.

## 2016-07-25 NOTE — ED Provider Notes (Signed)
History     Chief Complaint   Patient presents with    Leg Pain     HPI Comments: Cc left ankle pain    Mis step  Inversion left ankle  Sig lat ankle pain  Sild prox tib pain  Very poor gait.   New to [place]  No local care    No past medical history on file.     No past surgical history on file.  No family history on file.    Social History    has no tobacco, alcohol, drug, and sexual activity history on file.    Living Situation     Questions Responses    Patient lives with     Homeless     Caregiver for other family member     External Services     Employment     Domestic Violence Risk           Problem List   There is no problem list on file for this patient.      Review of Systems   Review of Systems   Constitutional: Negative for activity change, diaphoresis and fever.   HENT: Negative for congestion.    Eyes: Negative for discharge.   Respiratory: Negative for cough.    Cardiovascular: Negative for chest pain.   Gastrointestinal: Negative for abdominal pain.   Musculoskeletal: Negative for gait problem (no new joint or limb issues).   Skin: Negative for color change and rash.   Neurological: Negative for headaches.   Psychiatric/Behavioral: Negative for agitation.       Physical Exam     ED Triage Vitals   BP Heart Rate Heart Rate (via Pulse Ox) Resp Temp Temp src SpO2 (Retired) O2 Device O2 Flow Rate   07/25/16 2136 07/25/16 2136 -- 07/25/16 2136 07/25/16 2136 07/25/16 2136 07/25/16 2136 -- --   139/73 103  18 37 C (98.6 F) TEMPORAL 100 %        Weight           07/25/16 2136           83.9 kg (185 lb)                    Physical Exam   Constitutional: She appears well-developed.   HENT:   Head: Normocephalic.   Eyes: Conjunctivae are normal.   Neck: Normal range of motion. Neck supple.   Pulmonary/Chest: No stridor.   Musculoskeletal: Normal range of motion.   Sig left lat ankle pain  Mod left prox tib pain  Ankle x ray nl  Pending tib/fib  crutch est  Ankle brace prn  Rest and NSAID at home.   See  ortho if not improving   Neurological: She is alert.   Skin: Skin is warm. She is not diaphoretic.   Psychiatric: She has a normal mood and affect. Her behavior is normal. Thought content normal.   Nursing note and vitals reviewed.      Medical Decision Making        Initial Evaluation:  ED First Provider Contact     Date/Time Event User Comments    07/25/16 2116 ED First Provider Contact RIESENBERGER, BRIAN C Initial Face to Face Provider Contact          Patient seen by me on arrival date of 07/25/2016 at at time of arrival  9:15 PM.  Initial face to face evaluation time noted above may be discrepant due to patient acuity  and delay in documentation.    Assessment:  26 y.o.female comes to the ED with *ankle pain**    Differential Diagnosis includes *Sig left lat ankle pain  Mod left prox tib pain  Ankle x ray nl  Pending tib/fib  crutch est  Ankle brace prn  Rest and NSAID at home.   See ortho if not improving**                      Plan: *see above **  Danie Binder, MD           Danie Binder, MD  07/25/16 2245

## 2017-09-16 IMAGING — CT CT ABD-PELV W/ CM
2 of 4 series · 16 of 46 positions shown, 18 images · IV contrast (ISOVUE)
Comparison: 03/23/2016

CLINICAL DATA: Severe right lower quadrant pain for a few days.
Abnormal vaginal bleeding and nausea. White cell count 6.2. Negative
urine pregnancy test.

EXAM:
CT ABDOMEN AND PELVIS WITH CONTRAST
TECHNIQUE: Multidetector CT imaging of the abdomen and pelvis was performed
using the standard protocol following bolus administration of
intravenous contrast.
CONTRAST:  100mL KRMI0C-733 IOPAMIDOL (KRMI0C-733) INJECTION 61%

[Series 2: abd/pel with · axial · 0.67mm/px · z∈[-208,+177]mm · 13 of 87 slices shown, 15 images]
[im 5/87  soft-tissue]
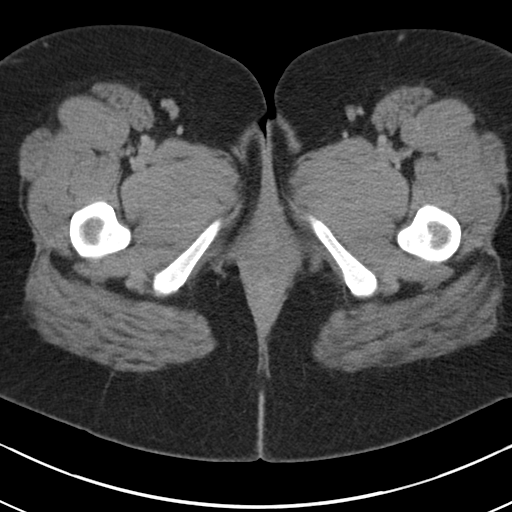
[im 5/87  bone]
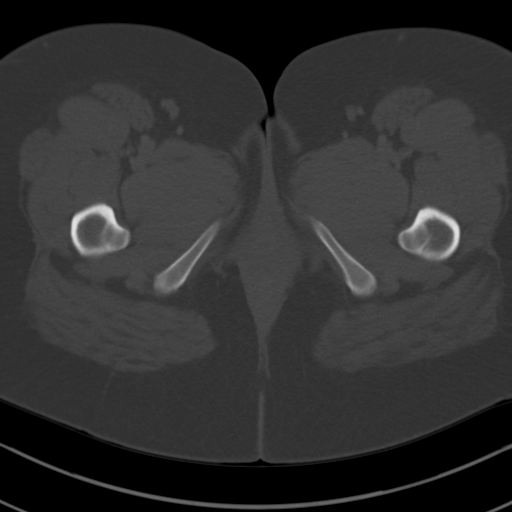
[im 13/87  soft-tissue]
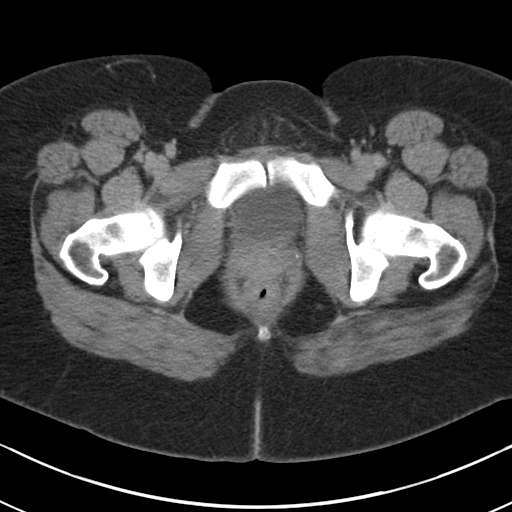
[im 17/87  soft-tissue]
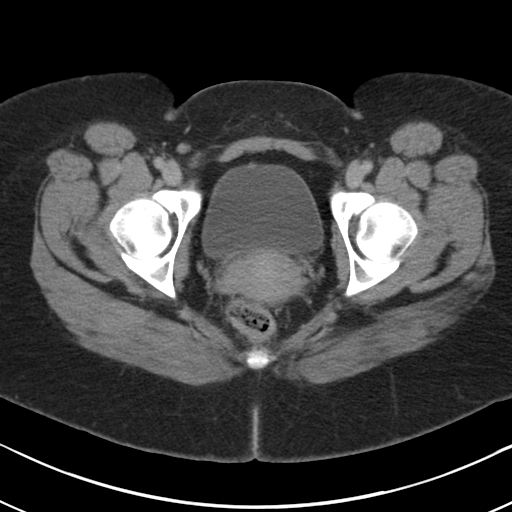
[im 25/87  soft-tissue]
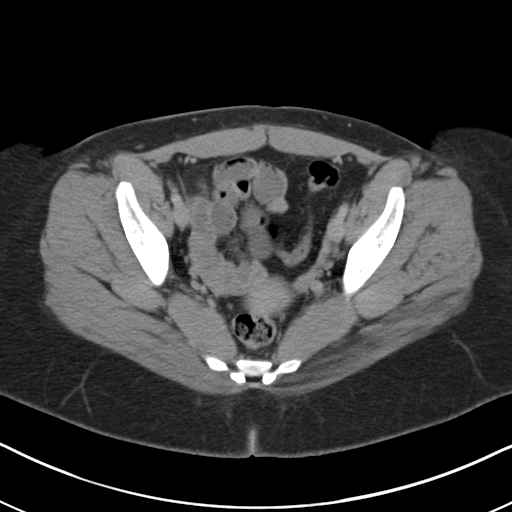
[im 29/87  soft-tissue]
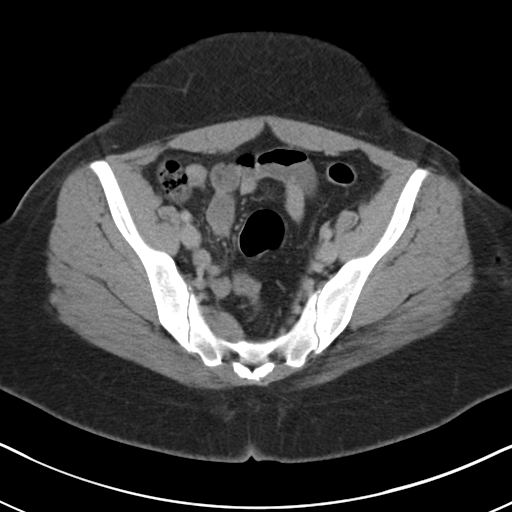
[im 37/87  soft-tissue]
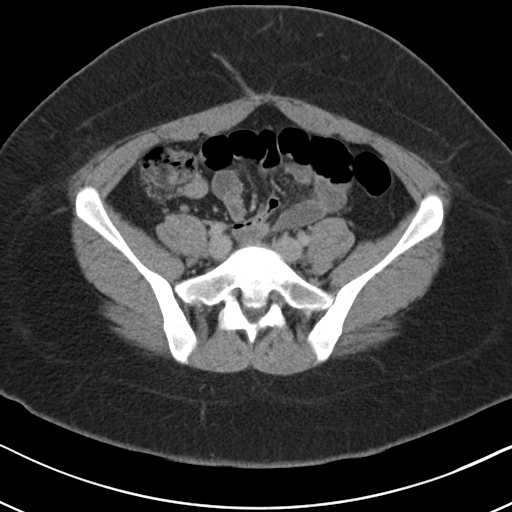
[im 46/87  soft-tissue]
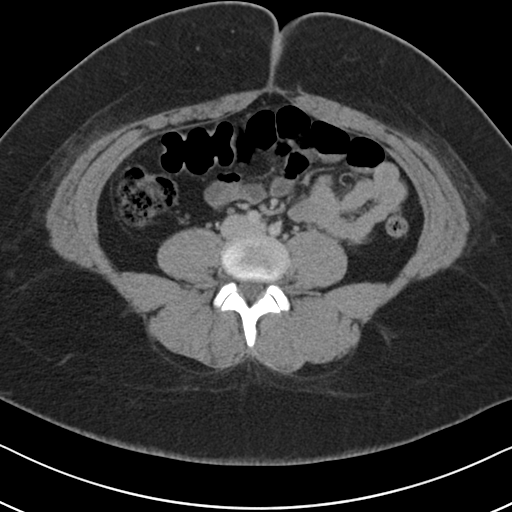
[im 50/87  soft-tissue]
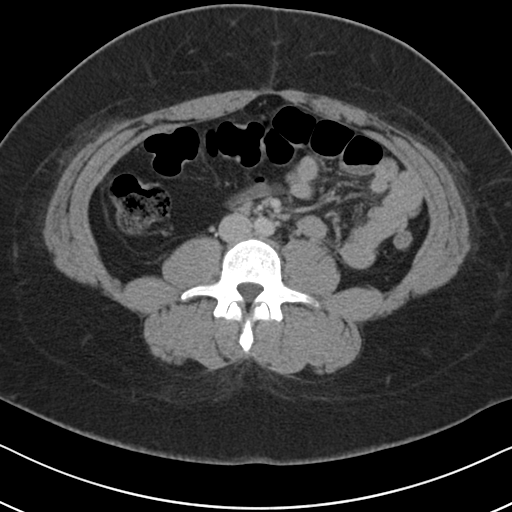
[im 58/87  soft-tissue]
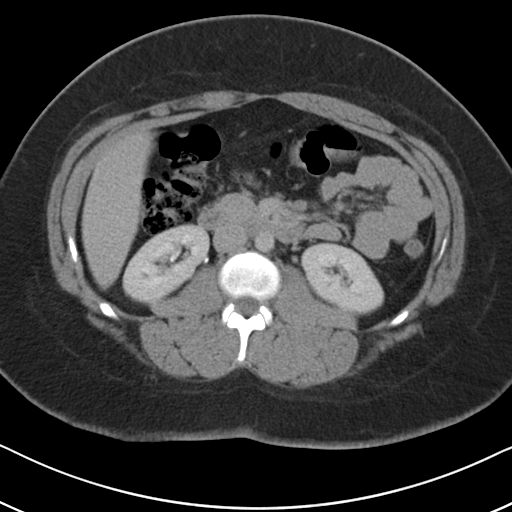
[im 58/87  bone]
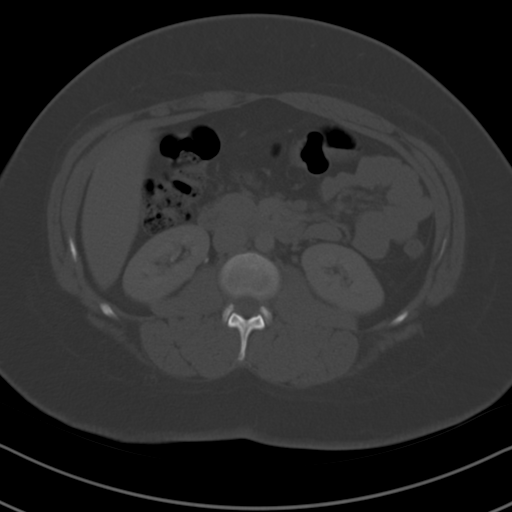
[im 62/87  soft-tissue]
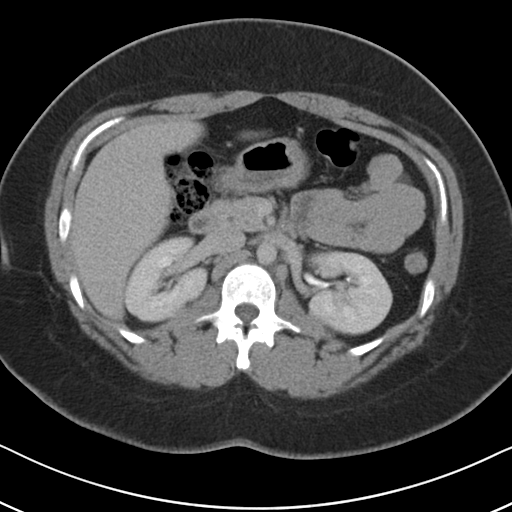
[im 70/87  soft-tissue]
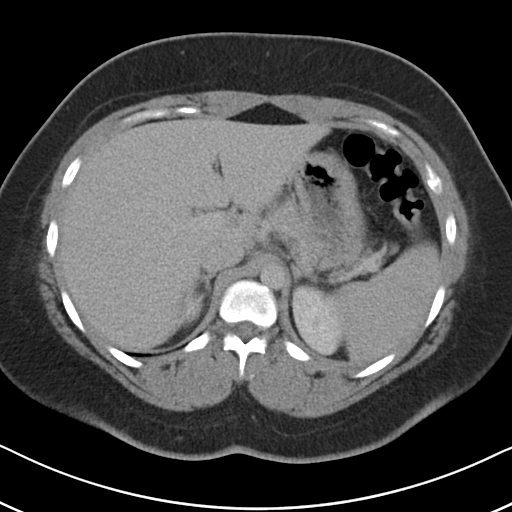
[im 74/87  soft-tissue]
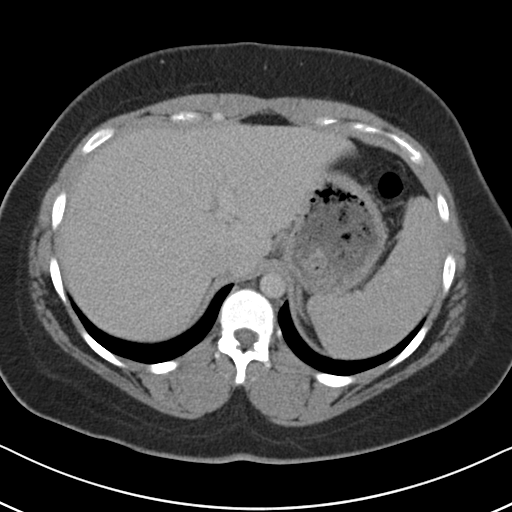
[im 82/87  soft-tissue]
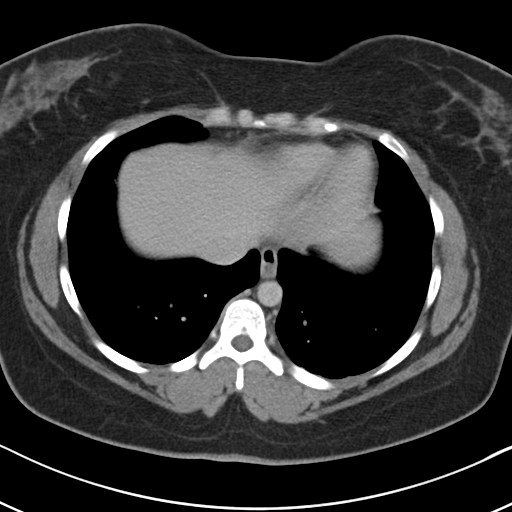

[Series 4: coronal a/|p · coronal · 0.77mm/px · 3 of 131 slices shown]
[im 44/131  soft-tissue]
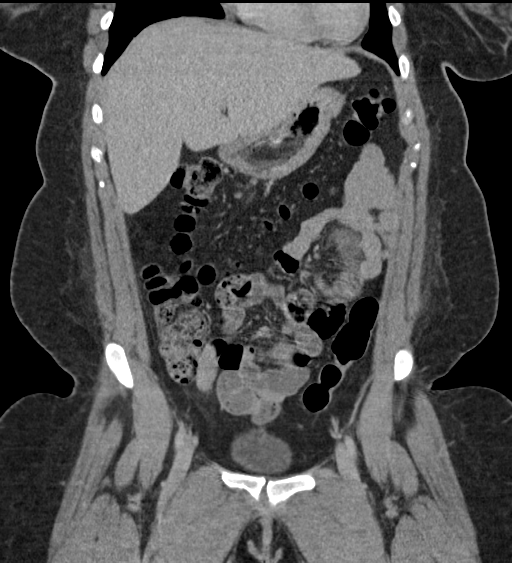
[im 58/131  soft-tissue]
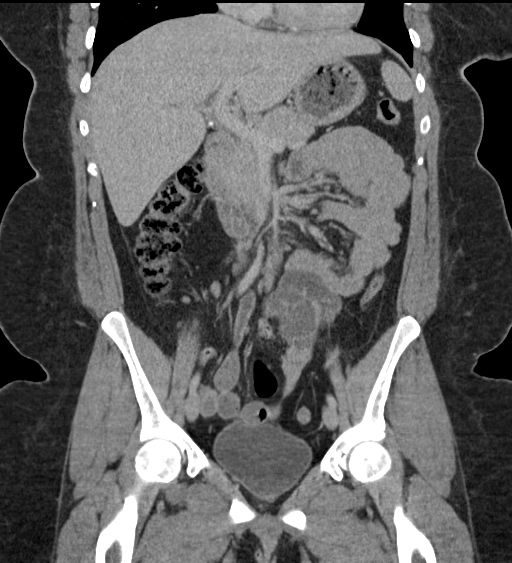
[im 73/131  soft-tissue]
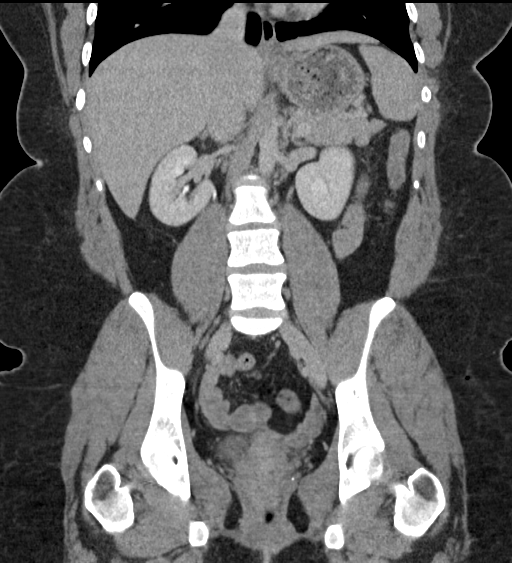

[16 of 46 positions shown; findings below may reference images not displayed]

FINDINGS: Lower chest: Mild dependent changes in the lung bases.

Hepatobiliary: Normal homogeneous appearance of the liver.
Gallbladder is not visualized and could be surgically absent or
contracted. No bile duct dilatation.

Pancreas: Unremarkable. No pancreatic ductal dilatation or
surrounding inflammatory changes.

Spleen: Normal in size without focal abnormality.

Adrenals/Urinary Tract: Adrenal glands are unremarkable. Kidneys are
normal, without renal calculi, focal lesion, or hydronephrosis.
Bladder is unremarkable.

Stomach/Bowel: Stomach is within normal limits. Appendix appears
normal. No evidence of bowel wall thickening, distention, or
inflammatory changes.

Vascular/Lymphatic: No significant vascular findings are present. No
enlarged abdominal or pelvic lymph nodes.

Reproductive: Uterus and bilateral adnexa are unremarkable.

Other: No abdominal wall hernia or abnormality. No abdominopelvic
ascites.

Musculoskeletal: No acute or significant osseous findings.
IMPRESSION: No acute process demonstrated in the abdomen or pelvis. No evidence
of bowel obstruction or inflammation. The appendix is normal.

## 2017-10-02 IMAGING — DX DG CHEST 2V
2 series · 2 of 2 positions shown · non-contrast
Comparison: 04/26/2013 .

CLINICAL DATA: Cough.  Dizziness.

EXAM:
CHEST  2 VIEW

[chest pa]
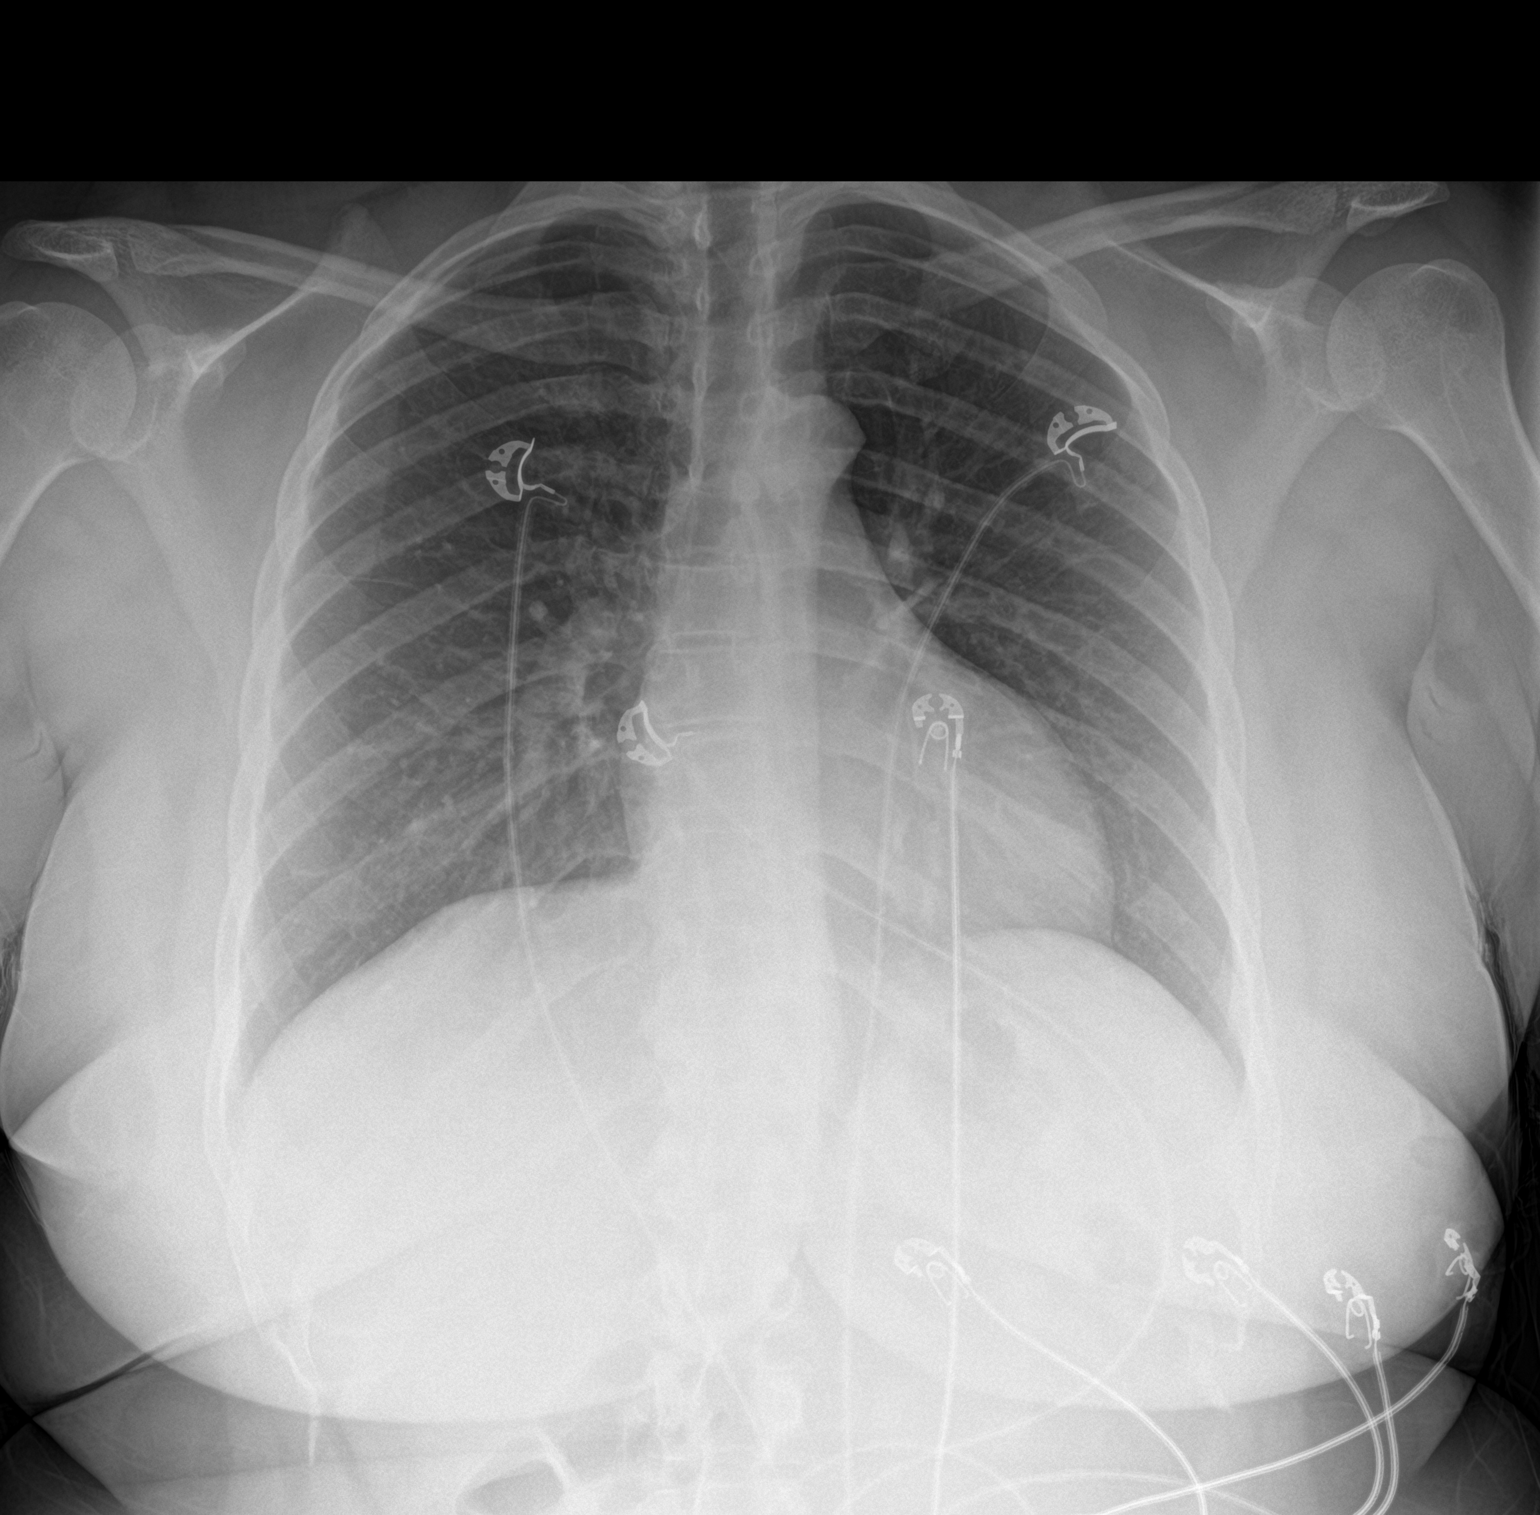

[chest lat]
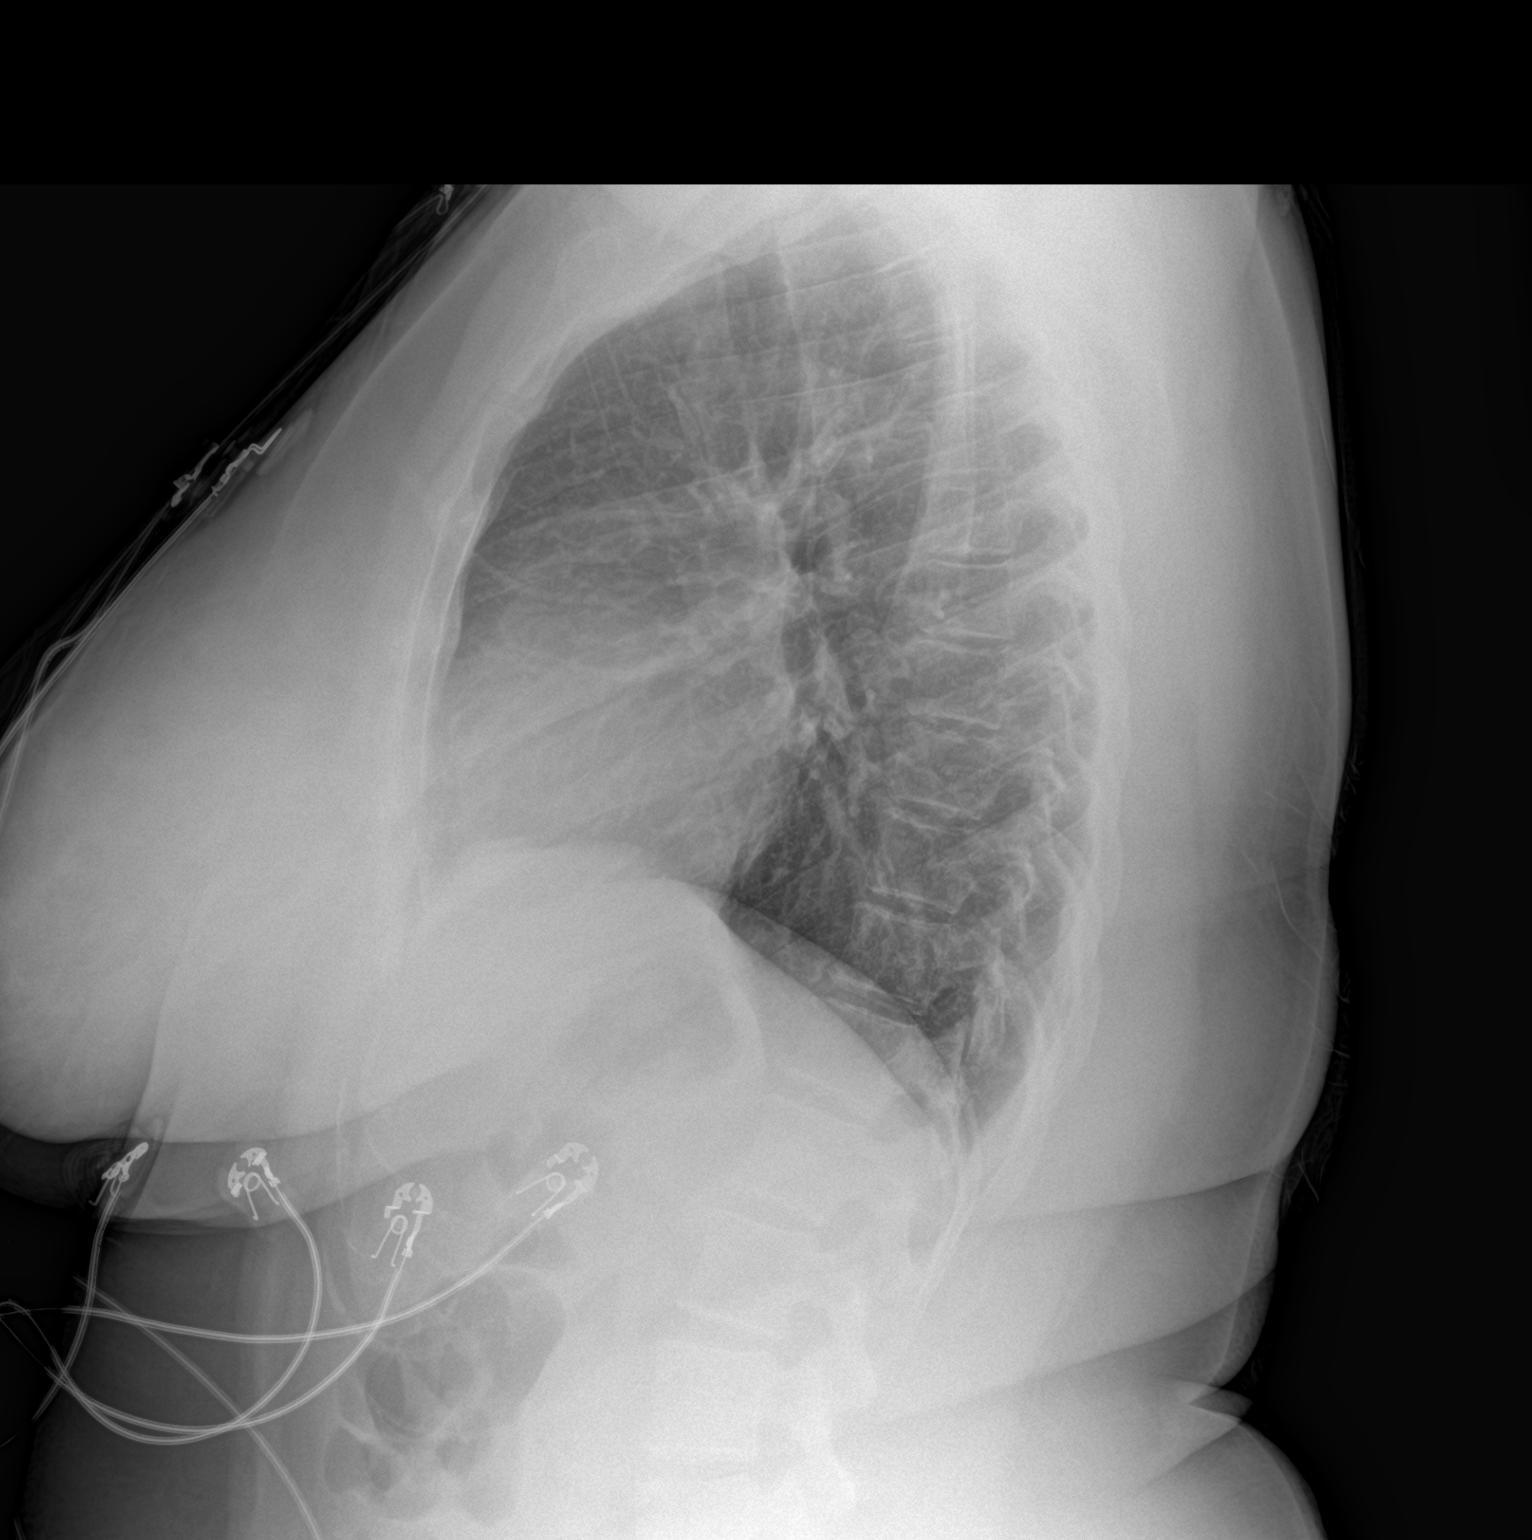

[2 of 2 positions shown; findings below may reference images not displayed]

FINDINGS: Mediastinum and hilar structures normal. Lungs are clear. No pleural
effusion or pneumothorax. No acute bony abnormality. Chest is stable
from prior study.
IMPRESSION: No acute abnormality .

## 2019-02-02 ENCOUNTER — Ambulatory Visit
Admission: AD | Admit: 2019-02-02 | Discharge: 2019-02-02 | Disposition: A | Discharge status: Home / Self Care | Payer: MEDICAID | Source: Ambulatory Visit | Attending: Emergency Medicine | Admitting: Emergency Medicine

## 2019-02-02 DIAGNOSIS — R591 Generalized enlarged lymph nodes: Secondary | ICD-10-CM | POA: Insufficient documentation

## 2019-02-02 DIAGNOSIS — J329 Chronic sinusitis, unspecified: Secondary | ICD-10-CM | POA: Insufficient documentation

## 2019-02-02 DIAGNOSIS — B9689 Other specified bacterial agents as the cause of diseases classified elsewhere: Secondary | ICD-10-CM | POA: Insufficient documentation

## 2019-02-02 HISTORY — DX: Unspecified osteoarthritis, unspecified site: M19.90

## 2019-02-02 HISTORY — DX: Fibromyalgia: M79.7

## 2019-02-02 MED ORDER — NAPROXEN 500 MG PO TABS *I*
500.0000 mg | ORAL_TABLET | Freq: Two times a day (BID) | ORAL | 0 refills | Status: DC
Start: 2019-02-02 — End: 2019-03-13

## 2019-02-02 MED ORDER — AZITHROMYCIN 250 MG PO TABS *I*
ORAL_TABLET | ORAL | 0 refills | Status: DC
Start: 2019-02-02 — End: 2019-03-13

## 2019-02-02 NOTE — ED Triage Notes (Signed)
pt with painful area back of head left side for over a week no redness or drainage noted        Triage Note   Janalyn Rouse, RN

## 2019-02-02 NOTE — UC Provider Note (Signed)
History     Chief Complaint   Patient presents with    Skin Problem     pt with painful area back of head left side for over a week no redness or drainage noted      The patient is a 28 year old female who presents to urgent care tonight with complaints of swollen lump to the left side of her head for the past 7 days.  Patient states this progressively got more painful and uncomfortable.  She is also reporting some sinus congestion and pressure, as well as some postnasal drip and ear pain for the past week with a constant headache.  Patient recently moved to the [place] area from [place] and suspects that she may be suffering from some seasonal allergies.  She denies any fevers, chills, sweats, sore throat, cough, chest pain, shortness of breath, nausea, vomiting, fatigue, weight loss, skin rash, head trauma.      History provided by:  Patient  Language interpreter used: No        Medical/Surgical/Family History     Past Medical History:   Diagnosis Date    Arthritis     Fibromyalgia         There is no problem list on file for this patient.           History reviewed. No pertinent surgical history.  History reviewed. No pertinent family history.       Social History     Tobacco Use    Smoking status: Never Smoker   Substance Use Topics    Alcohol use: Not on file    Drug use: Not on file     Living Situation     Questions Responses    Patient lives with     Homeless     Caregiver for other family member     External Services     Employment     Domestic Violence Risk                 Review of Systems   Review of Systems   Constitutional: Negative for chills, diaphoresis, fatigue and fever.   HENT: Positive for congestion, ear pain, postnasal drip, rhinorrhea, sinus pressure and sinus pain. Negative for sore throat.    Respiratory: Negative for cough, chest tightness and shortness of breath.    Musculoskeletal: Positive for neck pain.   Skin: Negative for color change and rash.   Neurological:  Positive for headaches.   Hematological: Positive for adenopathy.       Physical Exam   Triage Vitals  Triage Start: Start, (02/02/19 1901)   First Recorded BP: 159/73, Resp: 18, Temp: 37.1 C (98.8 F) Oxygen Therapy SpO2: 99 %, Heart Rate: 99, (02/02/19 1902)  .      Physical Exam  Vitals signs and nursing note reviewed.   Constitutional:       General: She is not in acute distress.     Appearance: She is well-developed.   HENT:      Head: Normocephalic and atraumatic.        Comments: Remainder of scalp exam is normal.     Right Ear: Tympanic membrane, ear canal and external ear normal.      Left Ear: Tympanic membrane, ear canal and external ear normal.      Nose: Mucosal edema, congestion and rhinorrhea present.      Right Sinus: Maxillary sinus tenderness and frontal sinus tenderness present.      Left Sinus:  Maxillary sinus tenderness and frontal sinus tenderness present.      Mouth/Throat:      Mouth: Mucous membranes are moist.      Pharynx: Pharyngeal swelling present.      Tonsils: 1+ on the right. 2+ on the left.   Eyes:      Conjunctiva/sclera: Conjunctivae normal.      Pupils: Pupils are equal, round, and reactive to light.   Neck:      Musculoskeletal: Normal range of motion and neck supple.   Cardiovascular:      Rate and Rhythm: Normal rate and regular rhythm.      Heart sounds: Normal heart sounds.   Pulmonary:      Effort: Pulmonary effort is normal. No respiratory distress.      Breath sounds: Normal breath sounds.   Lymphadenopathy:      Cervical: Cervical adenopathy present.      Left cervical: Superficial cervical adenopathy, deep cervical adenopathy and posterior cervical adenopathy present.   Skin:     General: Skin is warm and dry.   Neurological:      Mental Status: She is alert and oriented to person, place, and time.   Psychiatric:         Mood and Affect: Mood normal.         Behavior: Behavior normal.         Thought Content: Thought content normal.         Judgment: Judgment normal.           Medical Decision Making        Initial Evaluation:  ED First Provider Contact     Date/Time Event User Comments    02/02/19 1855 ED First Provider Contact Lonell Face M Initial Face to Face Provider Contact          Patient was seen on: 02/02/2019        Assessment:  28 y.o.female comes to the Urgent [facility] with c/o a swollen lump to her scalp, frontal headache, sinus congestion, pnd and ear pain for the past week.    Differential Diagnosis includes:  Acute Bacterial/Viral Sinusitis, Allergic rhinitis, Acute URI NOS, Migraine/Tension Headache, cyst, boil, abscess, lymphadenopathy, viral illness, TMJ.      Plan:   Orders Placed This Encounter    azithromycin (ZITHROMAX) 250 MG tablet    naproxen (NAPROSYN) 500 MG tablet       No results found for this or any previous visit (from the past 24 hour(s)).       Final Diagnosis    ICD-10-CM ICD-9-CM   1. Sinusitis, bacterial  J32.9 473.9    B96.89 041.9   2. Lymphadenopathy of head and neck  R59.1 785.6       Please start the new medications as below:  Current Discharge Medication List      New Medications    Details Last Dose Given Next Dose Due Script Given?   azithromycin (ZITHROMAX) 250 MG tablet Take 2 tablets (500 mg) on day 1, followed by 1 tablet (250 mg) on days 2-5.  Quantity 6 tablet, Refill 0  Start date: 02/02/2019       Comments: Emergency Encounter            naproxen (NAPROSYN) 500 mg Dose: 500 mg  Take 500 mg by mouth 2 times daily (with meals)  Quantity 20 tablet, Refill 0  Start date: 02/02/2019       Comments: Emergency Encounter  Please follow up with your physician as below:  Follow-up Information     [facility] In 3 days.    Specialty:  Emergency Medicine  Why:  If symptoms worsen, If symptoms do not improve  Contact information:  [address]   [place] [address]  [phone]             If short of breath, chest pains, worsening symptoms, or any other concerns please report to the  emergency room.    In the event of an Emergency dial 911.          Final Diagnosis  Final diagnoses:   [J32.9, B96.89] Sinusitis, bacterial (Primary)   [R59.1] Lymphadenopathy of head and neck         Concha Pyo, PA              Alysia Penna [place], [place]  02/02/19 1929

## 2019-02-04 LAB — UNMAPPED LAB RESULTS
Basophil # (HT): 0 10 3/uL — NL (ref 0.0–0.2)
Basophil % (HT): 1 % — NL (ref 0–3)
Eosinophil # (HT): 0.3 10 3/uL — NL (ref 0.0–0.6)
Eosinophil % (HT): 5 % — NL (ref 0–5)
Hematocrit (HT): 40 % — NL (ref 35–47)
Hemoglobin (HGB) (HT): 13.2 g/dL — NL (ref 12.0–16.0)
Lymphocyte # (HT): 1.3 10 3/uL — NL (ref 1.0–4.8)
Lymphocyte % (HT): 24 % — NL (ref 15–45)
MCHC (HT): 33.2 g/dL — NL (ref 31.0–37.5)
MCV (HT): 90 fL — NL (ref 80–100)
Mean Corpuscular Hemoglobin (MCH) (HT): 29.8 pg — NL (ref 26.0–34.0)
Monocyte # (HT): 0.4 10 3/uL — NL (ref 0.1–1.0)
Monocyte % (HT): 8 % — NL (ref 0–15)
Neutrophil # (HT): 3.2 10 3/uL — NL (ref 1.8–8.0)
Platelets (HT): 194 10 3/uL — NL (ref 150–450)
RBC (HT): 4.43 10 6/uL — NL (ref 3.80–5.20)
RDW (HT): 12.1 % — NL (ref 0.0–15.2)
Seg Neut % (HT): 62 % — NL (ref 45–75)
WBC (HT): 5.2 10 3/uL — NL (ref 4.0–11.0)

## 2019-03-13 ENCOUNTER — Ambulatory Visit
Admission: AD | Admit: 2019-03-13 | Discharge: 2019-03-13 | Disposition: A | Discharge status: Home / Self Care | Payer: Medicaid Other | Source: Ambulatory Visit | Attending: Thoracic Diseases | Admitting: Thoracic Diseases

## 2019-03-13 DIAGNOSIS — R399 Unspecified symptoms and signs involving the genitourinary system: Secondary | ICD-10-CM | POA: Insufficient documentation

## 2019-03-13 LAB — POCT URINALYSIS DIPSTICK
Bilirubin,Ur: NEGATIVE
Glucose,UA POCT: NORMAL mg/dL
Ketones,UA POCT: NEGATIVE mg/dL
Leuk Esterase,UA POCT: 1 — AB
Lot #: 43799603
Nitrite,UA POCT: NEGATIVE
PH,UA POCT: 5 (ref 5–8)
Protein,UA POCT: NEGATIVE mg/dL
Specific gravity,UA POCT: 1.015 (ref 1.002–1.030)
Urobilinogen,UA: NORMAL mg/dL

## 2019-03-13 LAB — POCT URINE PREGNANCY
Lot #: 193601
Preg Test,UR POC: NEGATIVE

## 2019-03-13 MED ORDER — NITROFURANTOIN MONOHYD MACRO 100 MG PO CAPS *I*
100.0000 mg | ORAL_CAPSULE | Freq: Two times a day (BID) | ORAL | 0 refills | Status: AC
Start: 2019-03-13 — End: 2019-03-18

## 2019-03-13 NOTE — UC Provider Note (Signed)
History     Chief Complaint   Patient presents with    Urinary Tract Infection     pt with pressure pain urgency fequency with uriantion, lower abd pressure for past 2 days      28 year old female presents urgent care for further evaluation of suprapubic/lower abdominal urinary urgency, frequency and burning for the past 2 days.  Patient denies any associated back or flank pain.  Vaginal pain or discharge.  No concern for STD or pregnancy.  Patient continues to eat and drink without difficulty.  No nausea or vomiting.  No new rashes.  No fever or chills.  Patient denies any history of UTIs in the past.          Medical/Surgical/Family History     Past Medical History:   Diagnosis Date    Arthritis     Fibromyalgia         There is no problem list on file for this patient.           History reviewed. No pertinent surgical history.  History reviewed. No pertinent family history.       Social History     Tobacco Use    Smoking status: Never Smoker   Substance Use Topics    Alcohol use: Not on file    Drug use: Not on file     Living Situation     Questions Responses    Patient lives with     Homeless     Caregiver for other family member     External Services     Employment     Domestic Violence Risk                 Review of Systems   Review of Systems   Constitutional: Negative for chills and fever.   Respiratory: Negative for cough and shortness of breath.    Gastrointestinal: Positive for abdominal pain.   Genitourinary: Positive for decreased urine volume, dysuria, frequency and urgency. Negative for flank pain, menstrual problem, vaginal bleeding, vaginal discharge and vaginal pain.   Musculoskeletal: Negative for back pain.   Skin: Negative for pallor.   Allergic/Immunologic: Negative for immunocompromised state.   Neurological: Negative for dizziness and headaches.   Psychiatric/Behavioral: Negative for confusion.       Physical Exam   Triage Vitals  Triage Start: Start, (03/13/19 1603)   First  Recorded BP: (!) 176/105, Resp: 16, Temp: 36.4 C (97.5 F), Temp src: TEMPORAL Oxygen Therapy SpO2: 98 %, Oximetry Source: Rt Hand, O2 Device: None (Room air), Heart Rate: (!) 127, (03/13/19 1559) Heart Rate (via Pulse Ox): 107, (03/13/19 1637).      Physical Exam  Vitals signs and nursing note reviewed.   Constitutional:       Appearance: Normal appearance. She is not ill-appearing.   HENT:      Head: Normocephalic.      Nose: Nose normal.      Mouth/Throat:      Mouth: Mucous membranes are moist.   Eyes:      Conjunctiva/sclera: Conjunctivae normal.   Neck:      Musculoskeletal: Normal range of motion and neck supple.   Cardiovascular:      Rate and Rhythm: Normal rate.   Pulmonary:      Effort: Pulmonary effort is normal.   Abdominal:      General: Bowel sounds are normal.      Palpations: Abdomen is soft.      Tenderness: There  is abdominal tenderness in the suprapubic area. There is no right CVA tenderness, left CVA tenderness or guarding.      Comments: Generalized supra pubic tenderness.  No CVA tenderness   Musculoskeletal: Normal range of motion.   Skin:     General: Skin is warm.      Coloration: Skin is not pale.   Neurological:      Mental Status: She is alert.   Psychiatric:         Mood and Affect: Mood normal.          Medical Decision Making        Initial Evaluation:  ED First Provider Contact     Date/Time Event User Comments    03/13/19 1612 ED First Provider Contact Marnell Mcdaniel A Initial Face to Face Provider Contact          Patient was seen on: 03/13/2019        Assessment:  28 y.o.female comes to the Ur[facility] [place] with urinary symptoms most consistent with UTI.  On exam patient is well-appearing and in no acute distress.  She is afebrile.  She is mildly tachycardic.  Patient tells me this is her baseline.  Patient just establish care with primary care provider if she has not had insurance until late.  UA with leuks and blood.    Differential Diagnosis includes:  Lower UTI  Acute  Cystitis  Interstitial cystitis  Acute Pyelonephritis  Hyperactive Bladder w/ urethral irritation  Acute Nephrolithiasis  STD    Plan:  will treat with 5-day course of Macrobid and send UA for culture to confirm UTI.  Reviewed other conservative treatment measures for symptoms as documented in discharge.  Recommended follow-up if symptoms persist or worsen despite treatment with antibiotics over the next 24 hours.  Patient verbalizes understanding and is in agreement with this plan        Orders Placed This Encounter    Bacterial urine culture    POCT urinalysis dipstick    POCT urine pregnancy    nitrofurantoin monohydrate macrocrystal (MACROBID) 100 MG capsule       Recent Results (from the past 24 hour(s))   POCT urinalysis dipstick    Collection Time: 03/13/19  4:20 PM   Result Value Ref Range    Specific gravity,UA POCT 1.015 1.002 - 1.030    PH,UA POCT 5.0 5 - 8    Leuk Esterase,UA POCT +1 (!) Negative    Nitrite,UA POCT Negative Negative    Protein,UA POCT Negative Negative mg/dL    Glucose,UA POCT Normal Normal mg/dL    Ketones,UA POCT Negative Negative mg/dL    Urobilinogen,UA Normal Less than 1 mg/dL    Bilirubin,Ur Negative Negative    Blood,UA POCT Trace (!) Negative    Exp date 5/21     Lot # [id number]    POCT urine pregnancy    Collection Time: 03/13/19  4:22 PM   Result Value Ref Range    Preg Test,UR POC Negative Negative-Dilute urine specimens may cause false negative urine pregnancy results...    INTERNAL CONTROL POCT URINE PREGNANCY *Yes-internal procedural control(s) acceptable     Exp date 7/21     Lot # [id number]            Final Diagnosis    ICD-10-CM ICD-9-CM   1. Urinary tract infection symptoms  R39.9 788.99       Current Discharge Medication List      New Medications    Details  Last Dose Given Next Dose Due Script Given?   nitrofurantoin monohydrate macrocrystal (MACROBID) 100 mg Dose: 100 mg  Take 100 mg by mouth 2 times daily  Quantity 10 capsule, Refill 0  Start date: 03/13/2019, End  date: 03/18/2019       Comments: Emergency Encounter                     Final Diagnosis  Final diagnoses:   [R39.9] Urinary tract infection symptoms (Primary)         Artelia Laroche, NP              Artelia Laroche, NP  03/13/19 1700

## 2019-03-13 NOTE — Discharge Instructions (Signed)
Hydrate to keep your urine a clear or pale yellow color. Can add cranberry juice to help clear the infection.       Tylenol can help with pain. Take every 4-6 hours as needed      OTC AZO can help with the discomfort while urinating      Avoid bladder irritants such as coffee, tea, carbonated drinks.      Empty your bladder frequently and do not hold urine.      For women please wipe from front to back, and get a new piece of tissue every time you wipe      Please complete the full antibiotic course as prescribed (even if you start to feel better)      Please monitor for any worsening pain radiating to flank area, fevers, chills, or vomiting and go to Emergency Department      Antibiotics can cause some GI upset, please take with food to help prevent that.      Please eat yogurt daily or take an oral probiotic while on antibiotics to help regulate your bacteria in your GI tract and prevent developing c-diff.

## 2019-03-13 NOTE — ED Notes (Signed)
Pt ambulatory.  Verbalized understanding of discharge instructions.  Safe to discharge at this time.

## 2019-03-13 NOTE — ED Triage Notes (Signed)
pt with pressure pain urgency fequency with uriantion, lower abd pressure for past 2 days        Triage Note   Janalyn Rouse, RN

## 2019-03-14 LAB — AEROBIC CULTURE: Aerobic Culture: 0

## 2019-06-09 LAB — UNMAPPED LAB RESULTS
Basophil # (HT): 0.1 10 3/uL — NL (ref 0.0–0.2)
Basophil % (HT): 1 % — NL (ref 0–3)
Eosinophil # (HT): 0.4 10 3/uL — NL (ref 0.0–0.6)
Eosinophil % (HT): 6 % — ABNORMAL HIGH (ref 0–5)
Hematocrit (HT): 39 % — NL (ref 35–47)
Hemoglobin (HGB) (HT): 13.1 g/dL — NL (ref 12.0–16.0)
Lymphocyte # (HT): 1.8 10 3/uL — NL (ref 1.0–4.8)
Lymphocyte % (HT): 31 % — NL (ref 15–45)
MCHC (HT): 33.5 g/dL — NL (ref 31.0–37.5)
MCV (HT): 88 fL — NL (ref 80–100)
Mean Corpuscular Hemoglobin (MCH) (HT): 29.6 pg — NL (ref 26.0–34.0)
Monocyte # (HT): 0.6 10 3/uL — NL (ref 0.1–1.0)
Monocyte % (HT): 10 % — NL (ref 0–15)
Neutrophil # (HT): 3.1 10 3/uL — NL (ref 1.8–8.0)
Platelets (HT): 250 10 3/uL — NL (ref 150–450)
RBC (HT): 4.43 10 6/uL — NL (ref 3.80–5.20)
RDW (HT): 12.1 % — NL (ref 0.0–15.2)
Seg Neut % (HT): 52 % — NL (ref 45–75)
WBC (HT): 5.9 10 3/uL — NL (ref 4.0–11.0)

## 2019-07-21 ENCOUNTER — Ambulatory Visit: Payer: Self-pay

## 2019-07-21 ENCOUNTER — Ambulatory Visit
Admission: AD | Admit: 2019-07-21 | Discharge: 2019-07-21 | Disposition: A | Discharge status: Home / Self Care | Payer: Medicaid Other | Source: Ambulatory Visit

## 2019-07-21 ENCOUNTER — Ambulatory Visit: Admit: 2019-07-21 | Discharge: 2019-07-21 | Disposition: A | Discharge status: Home / Self Care | Payer: Self-pay

## 2019-07-21 DIAGNOSIS — B070 Plantar wart: Secondary | ICD-10-CM

## 2019-07-21 DIAGNOSIS — M79609 Pain in unspecified limb: Secondary | ICD-10-CM

## 2019-07-21 DIAGNOSIS — X500XXA Overexertion from strenuous movement or load, initial encounter: Secondary | ICD-10-CM

## 2019-07-21 DIAGNOSIS — M79601 Pain in right arm: Secondary | ICD-10-CM

## 2019-07-21 DIAGNOSIS — M25511 Pain in right shoulder: Secondary | ICD-10-CM

## 2019-07-21 DIAGNOSIS — Y990 Civilian activity done for income or pay: Secondary | ICD-10-CM

## 2019-07-21 DIAGNOSIS — M79641 Pain in right hand: Secondary | ICD-10-CM

## 2019-07-21 DIAGNOSIS — M25521 Pain in right elbow: Secondary | ICD-10-CM

## 2019-07-21 MED ORDER — CYCLOBENZAPRINE HCL 5 MG PO TABS *I*
5.0000 mg | ORAL_TABLET | Freq: Three times a day (TID) | ORAL | 0 refills | Status: DC | PRN
Start: 2019-07-21 — End: 2020-02-16

## 2019-07-21 MED ORDER — IBUPROFEN 200 MG PO TABS *I*
600.0000 mg | ORAL_TABLET | Freq: Once | ORAL | Status: AC
Start: 2019-07-21 — End: 2019-07-21
  Administered 2019-07-21: 600 mg via ORAL

## 2019-07-21 NOTE — UC Provider Note (Signed)
History     Chief Complaint   Patient presents with    Hand Pain     Patient is a 29 year old female with past medical history of arthritis and fibromyalgia presenting to urgent care with chief complaint of right upper extremity pain and also left foot lesion.  She has had right upper extremity pain x1 week.  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  She feels that she may have injured her right upper extremity during this lifting.  The pain began at the elbow redness and spread to the shoulder and the hand.  She feels that her hand is weak because her forearm also feels tight.  She tried some over-the-counter medication without relief.  Patient denies any trauma to her right upper extremity.  She denies numbness.  Patient also has a second complaint of foot lesion at the bottom of the left foot.  She reports she thinks this might be a "callus."  The callus has grown in size of the last couple of months.  The area feels tender.  She denies walking barefoot.  Patient denies any other symptom or concern at this time.          Medical/Surgical/Family History     Past Medical History:   Diagnosis Date    Arthritis     Fibromyalgia         There is no problem list on file for this patient.           History reviewed. No pertinent surgical history.  No family history on file.       Social History     Tobacco Use    Smoking status: Never Smoker    Smokeless tobacco: Never Used   Substance Use Topics    Alcohol use: Not Currently    Drug use: Not on file     Living Situation     Questions Responses    Patient lives with     Homeless     Caregiver for other family member     External Services     Employment     Domestic Violence Risk                 Review of Systems   Review of Systems   Constitutional: Positive for activity change. Negative for chills and fever.   HENT: Negative.    Eyes: Negative.    Respiratory: Negative.    Cardiovascular: Negative.     Gastrointestinal: Negative.    Endocrine: Negative.    Genitourinary: Negative.    Musculoskeletal: Positive for arthralgias and myalgias. Negative for back pain, gait problem, joint swelling and neck pain.   Skin: Positive for wound. Negative for rash.   Allergic/Immunologic: Negative.    Neurological: Negative for numbness.   Hematological: Negative.    Psychiatric/Behavioral: Negative.        Physical Exam   Triage Vitals      First Recorded BP: (!) 173/112, Resp: 18, Temp: 37.3 C (99.1 F), Temp src: TEMPORAL Oxygen Therapy SpO2: 100 %, Oximetry Source: Rt Hand, O2 Device: None (Room air), Heart Rate: (!) 129, (07/21/19 1548)  .      Physical Exam  Constitutional:       General: She is not in acute distress.     Appearance: Normal appearance. She is well-developed. She is not ill-appearing, toxic-appearing or diaphoretic.   HENT:      Head:  Normocephalic.      Right Ear: External ear normal.      Left Ear: External ear normal.      Nose: Nose normal.   Eyes:      General:         Right eye: No discharge.         Left eye: No discharge.      Conjunctiva/sclera: Conjunctivae normal.   Pulmonary:      Effort: Pulmonary effort is normal.   Musculoskeletal:         General: Tenderness present. No deformity.      Right shoulder: She exhibits decreased range of motion, tenderness, pain, spasm and decreased strength. She exhibits no swelling.        Feet:       Comments: Range of motion of the right upper extremity appears full.  Grip strength on the right is 3 out of 5, left is 5 out of 5.  Tenderness is present at the shoulder, elbow, and hand in general.  Sensation intact.  Cap refill brisk.  Distal pulses intact.   Skin:     General: Skin is warm and dry.      Capillary Refill: Capillary refill takes less than 2 seconds.      Findings: No erythema or rash.   Neurological:      General: No focal deficit present.      Mental Status: She is alert and oriented to person, place, and time.      Sensory: No sensory  deficit.      Motor: No abnormal muscle tone.   Psychiatric:         Mood and Affect: Mood normal.         Behavior: Behavior normal.         Thought Content: Thought content normal.         Judgment: Judgment normal.          Medical Decision Making        Initial Evaluation:  ED First Provider Contact     Date/Time Event User Comments    07/21/19 1545 ED First Provider Contact Earney Navy Initial Face to Face Provider Contact          Patient was seen on: 07/21/2019        Assessment:  28 y.o.female comes to the Urgent Care Center with concern for right upper extremity pain and also a lesion at the bottom of the left foot.    Differential Diagnosis includes:  Overuse  Arm strain  Muscle spasm  Rotator cuff injury  Plantar wart.    Plan:     X-ray right shoulder: No acute fracture or dislocation seen. No acromioclavicular joint separation.    X-ray right elbow: No acute fracture or dislocation seen. No elbow joint effusion.      X-ray right hand: No acute fracture or dislocation seen. Joint spaces are intact    Patient declines Toradol.    Ibuprofen x 1    Arm sling    Flexeril    Referral to Primary care    Referral to Occupational medicine.    Orders Placed This Encounter    Sling    *Shoulder RIGHT standard AP, Grashey, and Lateral views    * Elbow RIGHT standard AP, Lateral, and both Obliques    * Hand RIGHT standard PA, Lateral, and Oblique views    ED/UC REFERRAL TO PRIMARY CARE    ED/UC REFERRAL TO OCCUPATIONAL MEDICINE  Please Provide Supply: Sling    ibuprofen (ADVIL,MOTRIN) tablet 600 mg    cyclobenzaprine (FLEXERIL) 5 MG tablet       No results found for this or any previous visit (from the past 24 hour(s)).        Final Diagnosis    ICD-10-CM ICD-9-CM   1. Pain in extremity, unspecified extremity  M79.609 729.5   2. Plantar wart, left foot  B07.0 078.12       Encourage fluids, encourage rest, good hand hygiene.    Use over the counter medications as discussed.    Please start the new  medications as below:    Current Discharge Medication List      New Medications    Details Last Dose Given Next Dose Due Script Given?   cyclobenzaprine (FLEXERIL) 5 mg Dose: 5 mg  Take 5 mg by mouth 3 times daily as needed for Muscle spasms  Quantity 30 tablet, Refill 0  Start date: 07/21/2019                   Please follow up with your physician as below:    Follow-up Information     Go to  [facility].    Specialty: Emergency Medicine  Why: If symptoms worsen, As needed  Contact information:  [address] [address]  [phone]               Thank you Rosalba Totty for coming to [facility] for your health care concerns.    If your condition changes and/or worsens please follow up with her primary doctor and/or return to the urgent care center.    If short of breath, chest pains or any other concerns please report to the emergency room.    In the event of an Emergency dial 911.      Final Diagnosis  Final diagnoses:   [F75.102] Pain in extremity, unspecified extremity (Primary)   [B07.0] Plantar wart, left foot         Paulette Blanch, PA              [place], [place]  07/21/19 [phone]

## 2019-07-21 NOTE — ED Triage Notes (Addendum)
Noticed pain in her right hand last week. Last night, she woke up during the night d/t pain and this morning unable to close her hand.    She states the pain is throbbing, occasionally has numbness/tingling in her right forearm, and when she touches her palm she notices pain shooting up her right arm.     Denies any injury or explanation for the pain.     Unrelated, she has a callus in her left foot and states she had it looked at last year but "it states like something is stuck in her foot".        Triage Note   Basil Dess, RN

## 2019-07-21 NOTE — Discharge Instructions (Signed)
Please follow up with the occupational medicine team  Please follow up with the primary care referral.  May alternate tylenol and ibuprofen for pain.  Use caution with cyclobenzaprine as this has sedating side effects.  Avoid prolonged use of the sling to prevent joint stiffness.  Please obtain over the counter plantar wart treatment.  Seek medical care if worsening or not improving

## 2019-08-04 ENCOUNTER — Telehealth: Payer: Self-pay | Admitting: Nurse Practitioner

## 2019-08-04 ENCOUNTER — Ambulatory Visit: Payer: Medicaid Other | Admitting: Nurse Practitioner

## 2019-08-04 NOTE — Telephone Encounter (Signed)
Patient no show for NPV

## 2019-09-01 ENCOUNTER — Other Ambulatory Visit: Payer: Self-pay | Admitting: Cardiology

## 2019-09-01 ENCOUNTER — Other Ambulatory Visit: Payer: Self-pay

## 2019-09-01 ENCOUNTER — Encounter: Payer: Self-pay | Admitting: Plastic Surgery

## 2019-09-01 ENCOUNTER — Emergency Department
Admission: EM | Admit: 2019-09-01 | Discharge: 2019-09-01 | Disposition: A | Discharge status: Home / Self Care | Payer: Medicaid Other | Source: Ambulatory Visit | Attending: Emergency Medicine | Admitting: Emergency Medicine

## 2019-09-01 DIAGNOSIS — I499 Cardiac arrhythmia, unspecified: Secondary | ICD-10-CM

## 2019-09-01 DIAGNOSIS — R030 Elevated blood-pressure reading, without diagnosis of hypertension: Secondary | ICD-10-CM | POA: Insufficient documentation

## 2019-09-01 DIAGNOSIS — F419 Anxiety disorder, unspecified: Secondary | ICD-10-CM | POA: Insufficient documentation

## 2019-09-01 DIAGNOSIS — R002 Palpitations: Secondary | ICD-10-CM

## 2019-09-01 DIAGNOSIS — R0602 Shortness of breath: Secondary | ICD-10-CM

## 2019-09-01 DIAGNOSIS — R000 Tachycardia, unspecified: Secondary | ICD-10-CM | POA: Insufficient documentation

## 2019-09-01 MED ORDER — AMLODIPINE BESYLATE 5 MG PO TABS *I*
5.0000 mg | ORAL_TABLET | Freq: Every day | ORAL | 0 refills | Status: AC
Start: 2019-09-01 — End: 2020-02-16
  Filled 2019-09-01: qty 30, 30d supply, fill #0

## 2019-09-01 MED ORDER — ESCITALOPRAM OXALATE 5 MG PO TABS *I*
5.0000 mg | ORAL_TABLET | Freq: Every day | ORAL | 0 refills | Status: DC
Start: 2019-09-01 — End: 2020-02-16
  Filled 2019-09-01: qty 30, 30d supply, fill #0

## 2019-09-01 NOTE — ED Provider Notes (Signed)
History     Chief Complaint   Patient presents with    Anxiety    Chest Pain     Pt with a PMH of anxiety, on hydroxyzine, fibromyalgia, RA ( not on any medications for this in quite a while) who presents with having been at work today today and she started feeling her heart race with heaviness in her chest , followed by hyperventilating, tingling in her hands and feeling short of breath. This stopped but she   Subsequently had another 3 similar episodes. She reports that this is typical for her panic attacks that she has had for quite some time. She was evaluated for a similar episode earlier this year in Jan at [facility]. At that she had a + DDimer and CTA of the chest was done that showed no PE. Her labs including TSH were otherwise unremarkable. She has noted since that time that her BP is usually elevated including when it is taken by a a nurse at her workplace. She has no SI / HI. She denies any chance of pregnancy / is not sexually active with men.           Medical/Surgical/Family History     Past Medical History:   Diagnosis Date    Arthritis     Fibromyalgia         There is no problem list on file for this patient.           History reviewed. No pertinent surgical history.  No family history on file.       Social History     Tobacco Use    Smoking status: Never Smoker    Smokeless tobacco: Never Used   Substance Use Topics    Alcohol use: Not Currently    Drug use: Not on file     Living Situation     Questions Responses    Patient lives with     Homeless     Caregiver for other family member     External Services     Employment     Domestic Violence Risk                 Review of Systems   Review of Systems   Respiratory: Positive for shortness of breath.    Cardiovascular: Positive for palpitations.   Psychiatric/Behavioral: Negative for self-injury and suicidal ideas. The patient is nervous/anxious.        Physical Exam     Triage Vitals  Triage Start: Start, (09/01/19 1812)   First Recorded BP:  137/87, Resp: 18, Temp: 36.9 C (98.4 F), Temp src: TEMPORAL Oxygen Therapy SpO2: 99 %, O2 Device: None (Room air), Heart Rate: (!) 118, (09/01/19 1802)  .  First Pain Reported  0-10 Scale: 0, (09/01/19 1802)       Physical Exam  Vitals and nursing note reviewed.   Constitutional:       Appearance: She is well-developed.   HENT:      Head: Normocephalic and atraumatic.   Cardiovascular:      Rate and Rhythm: Normal rate and regular rhythm.      Heart sounds: Normal heart sounds. No systolic murmur. No diastolic murmur.   Pulmonary:      Breath sounds: Normal breath sounds. No decreased breath sounds, wheezing, rhonchi or rales.   Musculoskeletal:         General: Normal range of motion.   Skin:     General: Skin is warm.  Neurological:      Mental Status: She is alert.   Psychiatric:         Attention and Perception: Attention normal.         Mood and Affect: Mood normal. Affect is not angry or inappropriate.         Speech: Speech normal.         Behavior: Behavior normal.         Thought Content: Thought content normal. Thought content is not paranoid or delusional. Thought content does not include homicidal or suicidal ideation.         Cognition and Memory: Cognition normal.         Judgment: Judgment normal.      Comments: Pt is quite concise, articulate and insightful regarding her issues with anxiety          Medical Decision Making   Patient seen by me on:  09/01/2019    Assessment:  Pt with hx of what she feel to be panic attacks in the past with 4 episodes today and similar attack that was worked up at [facility] in Jan of 2021     Differential diagnosis:  Anxiety / depression   Panic attack   I have very low suspicion for dysrhythmia   I have very low suspicion for PE / VTE ( full work up for this in Jan 2021)   Labs from Jan reviewed including TSH and Mg WNL     Plan:  EKG sinus tachy. I think pt's episodes are anxiety related and she is in agreement with this. In addition she has had consistently elevated  BP's over time taken randomly. I will refer her to a new PCP. I've discussed options including starting her on something for the anxiety and BP. Amlodipine has been well tolerated by her mother as an agent ( hx of cough with ACE in pt's mother) and a think a 5mg  dose of this daily is a reasonable choice as the increased urination with hctz may be an issue for her at work. In addition she is quite anxious to start treatment for her anxiety and I think a low dose SSRI is also reasonable to start. Stress need to f/u with a PCP ( urgent referral placed) and return precautions.     EKG Interpretation: Sinus rate [address] [place]          [place], [place], [place]  09/01/19 2145

## 2019-09-01 NOTE — ED Triage Notes (Signed)
Had a panic attack at work. Chest heaviness and mild SOB still present. Endorses feeling of heart racing. EKG in triage.        Triage Note   Orson Aloe, RN

## 2019-09-01 NOTE — Discharge Instructions (Addendum)
A referral has been placed for a new primary care provider     Lexapro ( escitalopram) 5mg  daily ( antidepressant) which hopefully will help with your anxiety but will take a couple of weeks to start working. You can continue to use the hydroxyzine as needed for acute anxiety issues in the meantime.     Norvasc ( amlodipine) 5mg  once daily for blood pressure.     Return to the emergency dept for problems or concerns of any kind

## 2019-09-01 NOTE — ED Notes (Signed)
Patient discharged per policy and order. AVS reviewed with patient who verbalized understanding of instructions. Patient has proper clothing in place, access to home, and transportation home. Patient ambulatory at discharge.

## 2019-09-03 LAB — EKG 12-LEAD
P: 57 deg
PR: 151 ms
QRS: 28 deg
QRSD: 75 ms
QT: 342 ms
QTc: 457 ms
Rate: 107 {beats}/min
T: 29 deg

## 2019-09-21 ENCOUNTER — Emergency Department: Payer: Medicaid Other

## 2019-09-21 ENCOUNTER — Other Ambulatory Visit: Payer: Self-pay | Admitting: Cardiology

## 2019-09-21 ENCOUNTER — Encounter: Payer: Self-pay | Admitting: Emergency Medicine

## 2019-09-21 ENCOUNTER — Other Ambulatory Visit: Payer: Self-pay

## 2019-09-21 ENCOUNTER — Emergency Department
Admission: EM | Admit: 2019-09-21 | Discharge: 2019-09-21 | Disposition: A | Discharge status: Home / Self Care | Payer: Medicaid Other | Source: Ambulatory Visit | Attending: Emergency Medicine | Admitting: Emergency Medicine

## 2019-09-21 DIAGNOSIS — R05 Cough: Secondary | ICD-10-CM

## 2019-09-21 DIAGNOSIS — R000 Tachycardia, unspecified: Secondary | ICD-10-CM

## 2019-09-21 DIAGNOSIS — R0602 Shortness of breath: Secondary | ICD-10-CM | POA: Insufficient documentation

## 2019-09-21 DIAGNOSIS — U071 COVID-19: Secondary | ICD-10-CM | POA: Insufficient documentation

## 2019-09-21 DIAGNOSIS — R509 Fever, unspecified: Secondary | ICD-10-CM

## 2019-09-21 DIAGNOSIS — R079 Chest pain, unspecified: Secondary | ICD-10-CM | POA: Insufficient documentation

## 2019-09-21 LAB — CBC AND DIFFERENTIAL
Baso # K/uL: 0 10*3/uL (ref 0.0–0.1)
Basophil %: 0 %
Eos # K/uL: 0 10*3/uL (ref 0.0–0.4)
Eosinophil %: 0 %
Hematocrit: 41 % (ref 34–45)
Hemoglobin: 13.5 g/dL (ref 11.2–15.7)
Lymph # K/uL: 0.5 10*3/uL — ABNORMAL LOW (ref 1.2–3.7)
Lymphocyte %: 20 %
MCH: 30 pg (ref 26–32)
MCHC: 33 g/dL (ref 32–36)
MCV: 90 fL (ref 79–95)
Mono # K/uL: 0.4 10*3/uL (ref 0.2–0.9)
Monocyte %: 16.7 %
Neut # K/uL: 1.3 10*3/uL — ABNORMAL LOW (ref 1.6–6.1)
Nucl RBC # K/uL: 0 10*3/uL (ref 0.0–0.0)
Nucl RBC %: 0 /100 WBC (ref 0.0–0.2)
Platelets: 225 10*3/uL (ref 160–370)
RBC: 4.5 MIL/uL (ref 3.9–5.2)
RDW: 12.2 % (ref 11.7–14.4)
Seg Neut %: 60 %
WBC: 2.1 10*3/uL — ABNORMAL LOW (ref 4.0–10.0)

## 2019-09-21 LAB — PLASMA PROF 7 (ED ONLY)
Anion Gap,PL: 9 (ref 7–16)
CO2,Plasma: 24 mmol/L (ref 20–28)
Chloride,Plasma: 105 mmol/L (ref 96–108)
Creatinine: 0.75 mg/dL (ref 0.51–0.95)
GFR,Black: 124 *
GFR,Caucasian: 107 *
Glucose,Plasma: 90 mg/dL (ref 60–99)
Potassium,Plasma: 4.4 mmol/L (ref 3.3–4.6)
Sodium,Plasma: 138 mmol/L (ref 133–145)
UN,Plasma: 5 mg/dL — ABNORMAL LOW (ref 6–20)

## 2019-09-21 LAB — DIFF MANUAL
Diff Based On: 30 CELLS
React Lymph %: 3 % (ref 0–6)

## 2019-09-21 LAB — HOLD BLUE

## 2019-09-21 LAB — PREGNANCY TEST, SERUM: Preg,Serum: NEGATIVE

## 2019-09-21 MED ORDER — DEXTROSE 5 % FLUSH FOR PUMPS *I*
0.0000 mL/h | INTRAVENOUS | Status: DC | PRN
Start: 2019-09-21 — End: 2019-09-22

## 2019-09-21 MED ORDER — SODIUM CHLORIDE 0.9 % IV BOLUS *I*
1000.0000 mL | Freq: Once | Status: AC
Start: 2019-09-21 — End: 2019-09-21
  Administered 2019-09-21: 1000 mL via INTRAVENOUS

## 2019-09-21 MED ORDER — SODIUM CHLORIDE 0.9 % FLUSH FOR PUMPS *I*
0.0000 mL/h | INTRAVENOUS | Status: DC | PRN
Start: 2019-09-21 — End: 2019-09-22

## 2019-09-21 MED ORDER — KETOROLAC TROMETHAMINE 30 MG/ML IJ SOLN *I*
15.0000 mg | Freq: Once | INTRAMUSCULAR | Status: AC
Start: 2019-09-21 — End: 2019-09-21
  Administered 2019-09-21: 15 mg via INTRAVENOUS
  Filled 2019-09-21: qty 1

## 2019-09-21 MED ORDER — ALBUTEROL SULFATE HFA 108 (90 BASE) MCG/ACT IN AERS *I*
1.0000 | INHALATION_SPRAY | RESPIRATORY_TRACT | 1 refills | Status: AC | PRN
Start: 2019-09-21 — End: 2019-10-21
  Filled 2019-09-21: qty 18, 16d supply, fill #0

## 2019-09-21 MED ORDER — ONDANSETRON HCL 2 MG/ML IV SOLN *I*
4.0000 mg | Freq: Once | INTRAMUSCULAR | Status: AC
Start: 2019-09-21 — End: 2019-09-21
  Administered 2019-09-21: 4 mg via INTRAVENOUS
  Filled 2019-09-21: qty 2

## 2019-09-21 MED ORDER — CYCLOBENZAPRINE HCL 10 MG PO TABS *I*
10.0000 mg | ORAL_TABLET | Freq: Three times a day (TID) | ORAL | 0 refills | Status: AC | PRN
Start: 2019-09-21 — End: 2019-09-28
  Filled 2019-09-21: qty 21, 7d supply, fill #0

## 2019-09-21 NOTE — ED Procedure Documentation (Signed)
Procedures   Venipuncture provider  Performed by: Elicia Lamp, MD  Authorized by: Elicia Lamp, MD     Consent:     Consent obtained:  Verbal    Consent given by:  Patient    Risks discussed:  Bleeding and infection    Alternatives discussed:  No treatment  Universal protocol:     Patient identity confirmed:  Verbally with patient and arm band  Pre-procedure details:     Skin preparation:  ChloraPrep  Anesthesia (see MAR for exact dosages):     Anesthesia method:  None  Procedure details:     Vein location:  Brachial    Vein laterality:  Right  Post-procedure details:     Patient tolerance of procedure:  Tolerated well, no immediate complications  Comments:      20 g placed with US guidance. Drew blood with ease, flushed with ease, secured with Tegaderm.         Elicia Lamp, MD     Elicia Lamp, MD  09/21/19 [phone]

## 2019-09-21 NOTE — ED Provider Notes (Signed)
History     Chief Complaint   Patient presents with    COVID-19 Concern    Chest Pain     29yo F presents with several days of fevers, dry cough, SOB, Chest Pain and diarrhea.  Pt is COVID positive.  She denies chance of pregnancy given that she is a virgin.  She denies h/o blood clots, cancer, smoking cigarettes, recent travel, recent surgery or hormonal therapy.  PMH: Fibromyalgia and Rheumatoid Arthritis but is not on any meds.      History provided by:  Patient  Language interpreter used: No        Medical/Surgical/Family History     Past Medical History:   Diagnosis Date    Arthritis     Fibromyalgia         There is no problem list on file for this patient.           No past surgical history on file.  No family history on file.       Social History     Tobacco Use    Smoking status: Never Smoker    Smokeless tobacco: Never Used   Substance Use Topics    Alcohol use: Not Currently    Drug use: Not on file     Living Situation     Questions Responses    Patient lives with     Homeless     Caregiver for other family member     External Services     Employment     Domestic Violence Risk                 Review of Systems   Review of Systems    Physical Exam     Triage Vitals  Triage Start: Start, (09/21/19 1235)   First Recorded BP: (!) 139/101, Resp: 18, Temp: 35.7 C (96.3 F), Temp src: TEMPORAL Oxygen Therapy SpO2: 100 %, Oximetry Source: Rt Hand, O2 Device: None (Room air), Heart Rate: (!) 130, (09/21/19 1236)  .  First Pain Reported  0-10 Scale: 10, Pain Location/Orientation: Abdomen, Pain Descriptors: Aching;Constant, (09/21/19 1236)       Physical Exam  Vitals and nursing note reviewed.   Constitutional:       General: She is not in acute distress.     Appearance: She is well-developed. She is not diaphoretic.   HENT:      Head: Normocephalic.   Neck:      Thyroid: No thyromegaly.      Vascular: Normal carotid pulses. No carotid bruit or JVD.   Cardiovascular:      Rate and Rhythm: Normal rate  and regular rhythm.      Heart sounds: Normal heart sounds. No murmur heard.   No friction rub. No gallop.    Pulmonary:      Effort: Pulmonary effort is normal. No respiratory distress.      Breath sounds: Normal breath sounds. No stridor. No wheezing or rales.   Chest:      Chest wall: No mass, deformity, tenderness, crepitus or edema.   Abdominal:      General: Bowel sounds are normal. There is no distension or abdominal bruit.      Palpations: Abdomen is soft. There is no fluid wave, hepatomegaly, splenomegaly or mass.      Tenderness: There is no abdominal tenderness. There is no guarding or rebound.   Musculoskeletal:         General: No tenderness. Normal range of  motion.      Cervical back: Normal range of motion and neck supple.      Right lower leg: No tenderness. No edema.      Left lower leg: No tenderness. No edema.   Lymphadenopathy:      Cervical: No cervical adenopathy.   Skin:     General: Skin is warm and dry.      Capillary Refill: Capillary refill takes less than 2 seconds.      Coloration: Skin is not pale.      Findings: No erythema or rash.   Neurological:      General: No focal deficit present.      Mental Status: She is alert and oriented to person, place, and time.   Psychiatric:         Behavior: Behavior normal.         Thought Content: Thought content normal.         Judgment: Judgment normal.         Medical Decision Making   Patient seen by me on:  09/21/2019    Assessment:  29yo F presents with several days of fevers, dry cough, SOB, Chest Pain and diarrhea.  Pt is COVID positive.  She denies chance of pregnancy given that she is a virgin.  She denies h/o blood clots, cancer, smoking cigarettes, recent travel, recent surgery or hormonal therapy.  PMH: Fibromyalgia and Rheumatoid Arthritis but is not on any meds.    Differential diagnosis:  COVID 19 Infection vs PE vs Dehydration.    Plan:  No risk factors for PE.  NS boluses for total of 2 liters.  Cxr negative.  Labs: CBC with diff,  ED7 and BHCG.  Zofran 4mg  IV x 1.  Toradol 15mg  IV x 1.               Ricci Dirocco , NP          , NP  09/21/19 1550

## 2019-09-21 NOTE — ED Notes (Signed)
Pt verbalized understanding of d/c instructions. VSS, IV removed. New prescriptions discussed and education provided. Pt states she drove her self here today, leaving department ambulatory.

## 2019-09-21 NOTE — Discharge Instructions (Signed)
You had a normal chest xray without pneumonia.    Your blood work was without concerns.    Medications prescribed:   Flexeril (cyclobenzaprine) 10mg  every 8 hours as needed for muscle pain.  Albuterol inhaler 2 puffs every 4 hours as needed for difficulty breathing or shortness of breath.    Please follow up with your Pcp this week.    If you need a Pcp see list:  Primary care physicians accepting new patients:   Our doctors are affiliated with [facility], [facility], [facility],   [facility], [facility] and [facility].   [facility]   [address]  [place], [place]  [phone]  [phone], MBBS (IM)    [facility]  [address] [place], [place]  [phone]  [phone], MD (IM)    [facility]   [address]  [place], [place]  [phone]  [phone], MD (FM)  Carilyn Goodpasture, MD (FM)     [facility]- [place]   [address]   [place], [place]  [phone]   [phone], MD (FM)     [facility]- [facility]. [place]  [address]  [place]. [place], [place]  [phone]  [phone], MD (FM)  Milinda Antis, MD (FM)   [facility]   [address] [place], [place]  [place]  [phone]   Please call for a list of physicians accepting new patients    [facility]-  [place]  [address] [place]  [address]  [place], [place]  [phone]  [phone], MD (IM)     [facility]-  [facility]  [address]  [place], [place]  [phone]  [phone], MD (MP)  Jacklynn Bue, MD (MP)  Gwenyth Allegra, MD (MP)  Edison Nasuti, MD (MP)  Dessie Coma, MD (MP)   Joanne Chars, MD (MP)    [facility]- [facility]  [address]  [place], [place]  [phone]  [phone], MD (FM)  Antony Contras,  MD (FM)             [facility]   [address]  [place], [place]  [phone]  [phone], MD (IM)    [facility]- [place]  [address]  [place], [place]  [phone]  [phone], MD (IM)    [facility]- [facility]  [address]  [place], [place]  [phone]  [phone], MD (IM)    [facility]  [address]  [place], [place]  [phone]  [phone], MD (FM)     [facility] -   [facility]  [address]  [place], [place]  [phone]  [phone], MD (Peds)  Jerlyn Ly, MD (Peds)  Fernanda Drum, MD (Peds)      FM: Family Medicine   IM: Internal Medicine  IM/Peds: Internal Medicine & Pediatrics  MP: Family Medicine & Pediatrics  OB Obstetrics  Peds: Pediatrics   For physician referral services, please call [phone] visit [phone]     Updated  09/02/2019

## 2019-09-21 NOTE — ED Triage Notes (Addendum)
COVID+ on 4/27 with worsening SOB, CP, and abdominal with nausea. HR of 130 in triage. EKG in triage.       Triage Note   Trilby Leaver, RN

## 2019-09-28 LAB — EKG 12-LEAD
P: 55 deg
PR: 137 ms
QRS: 27 deg
QRSD: 76 ms
QT: 313 ms
QTc: 443 ms
Rate: 120 {beats}/min
T: 35 deg

## 2019-11-11 ENCOUNTER — Other Ambulatory Visit: Payer: Self-pay

## 2019-11-11 ENCOUNTER — Ambulatory Visit
Admission: AD | Admit: 2019-11-11 | Discharge: 2019-11-11 | Disposition: A | Discharge status: Home / Self Care | Payer: Medicaid Other | Source: Ambulatory Visit | Attending: Orthopedic Surgery | Admitting: Orthopedic Surgery

## 2019-11-11 DIAGNOSIS — J069 Acute upper respiratory infection, unspecified: Secondary | ICD-10-CM

## 2019-11-11 LAB — HM HIV SCREENING OFFERED

## 2019-11-11 MED ORDER — PREDNISONE 20 MG PO TABS *I*
40.0000 mg | ORAL_TABLET | Freq: Every day | ORAL | 0 refills | Status: AC
Start: 2019-11-11 — End: 2019-11-16
  Filled 2019-11-11: qty 10, 5d supply, fill #0

## 2019-11-11 NOTE — Discharge Instructions (Signed)
You are seen today for viral upper respiratory illness.  I did send prescription to the pharmacy for prednisone to take once a day for 5 days.  Rest, increase fluids.  You can use your albuterol inhaler as needed for shortness of breath.  Follow-up with your primary care doctor as needed.

## 2019-11-11 NOTE — ED Triage Notes (Signed)
Pt. Presents to UC with c/o sore throat, cough, vomiting with cough, post nasal drip x 3 days. Pt. States covid positive a month ago.       Triage Note   Adriana Simas, RN

## 2019-11-11 NOTE — UC Provider Note (Signed)
History     Chief Complaint   Patient presents with    COVID-19 Concern     HPI : 29 year old female presents today with complaints of a sore throat, cough, postnasal drip over the past 3 days.  Patient was Covid positive approximately 1 month ago.  She states she gets coughing so much she does vomit.  She denies any shortness of breath or wheezing.  No chest pain.  No nausea vomiting or diarrhea.  No loss of taste or smell.  She denies any difficulty swallowing her own secretions.  Patient has not been taking anything over-the-counter.    Medical/Surgical/Family History     Past Medical History:   Diagnosis Date    Arthritis     Fibromyalgia         There is no problem list on file for this patient.           History reviewed. No pertinent surgical history.  History reviewed. No pertinent family history.       Social History     Tobacco Use    Smoking status: Never Smoker    Smokeless tobacco: Never Used   Substance Use Topics    Alcohol use: Not Currently    Drug use: Not on file     Living Situation     Questions Responses    Patient lives with     Homeless     Caregiver for other family member     External Services     Employment     Domestic Violence Risk                 Review of Systems   Review of Systems   Constitutional: Negative for chills and fever.   HENT: Positive for postnasal drip and sore throat. Negative for congestion and ear pain.    Eyes: Negative.    Respiratory: Positive for cough. Negative for chest tightness, shortness of breath and wheezing.    Cardiovascular: Negative.    Gastrointestinal: Negative.    Genitourinary: Negative.    Musculoskeletal: Negative.    Skin: Negative.    Neurological: Negative.        Physical Exam   Triage Vitals  Triage Start: Start, (11/11/19 2029)   First Recorded BP: (!) 182/110, Resp: 18, Temp: 36.4 C (97.5 F), Temp src: TEMPORAL Oxygen Therapy SpO2: 100 %, Oximetry Source: Rt Hand, O2 Device: None (Room air), Heart Rate: (!) 125, (11/11/19 2029)   .      Physical Exam  Vitals and nursing note reviewed.   Constitutional:       Appearance: Normal appearance.   HENT:      Head: Normocephalic and atraumatic.      Right Ear: Tympanic membrane and ear canal normal.      Left Ear: Tympanic membrane and ear canal normal.      Nose: Rhinorrhea present.      Mouth/Throat:      Mouth: Mucous membranes are moist.      Pharynx: Posterior oropharyngeal erythema present.   Cardiovascular:      Rate and Rhythm: Normal rate and regular rhythm.      Pulses: Normal pulses.      Heart sounds: Normal heart sounds.   Pulmonary:      Effort: Pulmonary effort is normal.      Breath sounds: Normal breath sounds.   Skin:     General: Skin is warm and dry.   Neurological:  Mental Status: She is alert and oriented to person, place, and time.          Medical Decision Making        Medical Decision Making  Assessment:    29 year old female presents today with viral upper respiratory illness.    Differential diagnosis:    Viral upper respiratory illness, bronchitis, pneumonia, pharyngitis, otitis media    Plan and Results:    You are seen today for viral upper respiratory illness.  I did send prescription to the pharmacy for prednisone to take once a day for 5 days.  Rest, increase fluids.  You can use your albuterol inhaler as needed for shortness of breath.  Follow-up with your primary care doctor as needed.    Encounter orders    Orders Placed This Encounter      HM HIV SCREENING OFFERED      predniSONE (DELTASONE) 20 MG tablet    Lab results   No results found for this or any previous visit (from the past 24 hour(s)).    Independent Review of: chart/prior records    Diagnosis and Disposition:           Discharge Medications  Current Discharge Medication List    New Medications    predniSONE (DELTASONE) 40 mg  Dose: 40 mg  Take 40 mg by mouth daily  Quantity 10 tablet Refill 0  Start date: 11/11/2019, End date: 11/16/2019  Comments: Emergency  Encounter        Follow-up  @AVSFOLLOWUP @        Final Diagnosis  Final diagnoses:   [J06.9] Viral upper respiratory illness (Primary)         , PA              Clelia Croft, Clelia Croft  11/14/19 [phone]

## 2019-11-13 ENCOUNTER — Telehealth: Payer: Self-pay

## 2019-11-13 NOTE — Telephone Encounter (Signed)
Spoke with patient regarding her concerns. Patient has diagnosis of viral URI not whooping cough. Patient feels better and is most likely not contagious at this point. She will return to work tomorrow as planned. She has been vaccinated for pertussis as a child.      Artelia Laroche, NP  11/13/19 1016

## 2019-11-13 NOTE — Telephone Encounter (Signed)
Patient called wondering  how long she is going to be contagious for with having whooping cough.

## 2019-12-02 ENCOUNTER — Emergency Department: Payer: Medicaid Other

## 2019-12-02 ENCOUNTER — Other Ambulatory Visit: Payer: Self-pay

## 2019-12-02 ENCOUNTER — Encounter: Payer: Self-pay | Admitting: Emergency Medicine

## 2019-12-02 ENCOUNTER — Emergency Department
Admission: EM | Admit: 2019-12-02 | Discharge: 2019-12-02 | Disposition: A | Discharge status: Home / Self Care | Payer: Medicaid Other | Source: Ambulatory Visit | Attending: Emergency Medicine | Admitting: Emergency Medicine

## 2019-12-02 DIAGNOSIS — R109 Unspecified abdominal pain: Secondary | ICD-10-CM

## 2019-12-02 DIAGNOSIS — K921 Melena: Secondary | ICD-10-CM

## 2019-12-02 DIAGNOSIS — R112 Nausea with vomiting, unspecified: Secondary | ICD-10-CM

## 2019-12-02 DIAGNOSIS — R110 Nausea: Secondary | ICD-10-CM | POA: Insufficient documentation

## 2019-12-02 DIAGNOSIS — R1013 Epigastric pain: Secondary | ICD-10-CM

## 2019-12-02 LAB — CBC AND DIFFERENTIAL
Baso # K/uL: 0.1 10*3/uL (ref 0.0–0.1)
Basophil %: 1.3 %
Eos # K/uL: 0.2 10*3/uL (ref 0.0–0.4)
Eosinophil %: 4.3 %
Hematocrit: 42 % (ref 34–45)
Hemoglobin: 13.9 g/dL (ref 11.2–15.7)
IMM Granulocytes #: 0 10*3/uL (ref 0.0–0.0)
IMM Granulocytes: 0.4 %
Lymph # K/uL: 1.5 10*3/uL (ref 1.2–3.7)
Lymphocyte %: 31.5 %
MCH: 30 pg (ref 26–32)
MCHC: 33 g/dL (ref 32–36)
MCV: 92 fL (ref 79–95)
Mono # K/uL: 0.5 10*3/uL (ref 0.2–0.9)
Monocyte %: 10.7 %
Neut # K/uL: 2.4 10*3/uL (ref 1.6–6.1)
Nucl RBC # K/uL: 0 10*3/uL (ref 0.0–0.0)
Nucl RBC %: 0 /100 WBC (ref 0.0–0.2)
Platelets: 266 10*3/uL (ref 160–370)
RBC: 4.6 MIL/uL (ref 3.9–5.2)
RDW: 12.9 % (ref 11.7–14.4)
Seg Neut %: 51.8 %
WBC: 4.7 10*3/uL (ref 4.0–10.0)

## 2019-12-02 LAB — PLASMA PROF 7 (ED ONLY)
Anion Gap,PL: 16 (ref 7–16)
CO2,Plasma: 17 mmol/L — ABNORMAL LOW (ref 20–28)
Chloride,Plasma: 103 mmol/L (ref 96–108)
Creatinine: 0.73 mg/dL (ref 0.51–0.95)
GFR,Black: 128 *
GFR,Caucasian: 111 *
Glucose,Plasma: 90 mg/dL (ref 60–99)
Sodium,Plasma: 136 mmol/L (ref 133–145)
UN,Plasma: 8 mg/dL (ref 6–20)

## 2019-12-02 LAB — RUQ PANEL (ED ONLY)
Albumin: 4.4 g/dL (ref 3.5–5.2)
Bilirubin,Direct: <0.2 mg/dL (ref 0.0–0.3)
Bilirubin,Total: 0.5 mg/dL (ref 0.0–1.2)
Lipase: 31 U/L (ref 13–60)

## 2019-12-02 MED ORDER — IOHEXOL 350 MG/ML (OMNIPAQUE) IV SOLN *I*
1.0000 mL | Freq: Once | INTRAVENOUS | Status: AC
Start: 2019-12-02 — End: 2019-12-02
  Administered 2019-12-02: 150 mL via INTRAVENOUS

## 2019-12-02 MED ORDER — HYDROMORPHONE HCL PF 1 MG/ML IJ SOLN *WRAPPED*
0.5000 mg | Freq: Once | INTRAMUSCULAR | Status: AC
Start: 2019-12-02 — End: 2019-12-02
  Administered 2019-12-02: 0.5 mg via INTRAVENOUS
  Filled 2019-12-02: qty 0.5

## 2019-12-02 MED ORDER — DEXTROSE 5 % FLUSH FOR PUMPS *I*
0.0000 mL/h | INTRAVENOUS | Status: DC | PRN
Start: 2019-12-02 — End: 2019-12-02

## 2019-12-02 MED ORDER — ALUM & MAG HYDROXIDE-SIMETH 200-200-20 MG/5ML PO SUSP *I*
30.0000 mL | Freq: Once | ORAL | Status: AC
Start: 2019-12-02 — End: 2019-12-02
  Administered 2019-12-02: 30 mL via ORAL
  Filled 2019-12-02: qty 30

## 2019-12-02 MED ORDER — FAMOTIDINE 40 MG PO TABS *I*
40.0000 mg | ORAL_TABLET | Freq: Every evening | ORAL | 0 refills | Status: DC
Start: 2019-12-02 — End: 2020-02-16
  Filled 2019-12-02: qty 30, 30d supply, fill #0

## 2019-12-02 MED ORDER — OPTICHAMBER DIAMOND-LG MASK DEVI
0 refills | Status: AC
Start: 2019-12-02 — End: ?
  Filled 2019-12-02: qty 1, 30d supply, fill #0

## 2019-12-02 MED ORDER — ONDANSETRON HCL 2 MG/ML IV SOLN *I*
4.0000 mg | Freq: Once | INTRAMUSCULAR | Status: AC
Start: 2019-12-02 — End: 2019-12-02
  Administered 2019-12-02: 4 mg via INTRAVENOUS
  Filled 2019-12-02: qty 2

## 2019-12-02 MED ORDER — ONDANSETRON 4 MG PO TBDP *I*
4.0000 mg | ORAL_TABLET | Freq: Three times a day (TID) | ORAL | 0 refills | Status: AC | PRN
Start: 2019-12-02 — End: ?
  Filled 2019-12-02: qty 12, 4d supply, fill #0

## 2019-12-02 MED ORDER — HYDROXYZINE HCL 25 MG PO TABS *I*
25.0000 mg | ORAL_TABLET | Freq: Three times a day (TID) | ORAL | 0 refills | Status: AC | PRN
Start: 2019-12-02 — End: 2020-01-01
  Filled 2019-12-02: qty 30, 10d supply, fill #0

## 2019-12-02 MED ORDER — SODIUM CHLORIDE 0.9 % FLUSH FOR PUMPS *I*
0.0000 mL/h | INTRAVENOUS | Status: DC | PRN
Start: 2019-12-02 — End: 2019-12-02

## 2019-12-02 MED ORDER — SODIUM CHLORIDE 0.9 % IV BOLUS *I*
1000.0000 mL | Freq: Once | Status: AC
Start: 2019-12-02 — End: 2019-12-02
  Administered 2019-12-02: 1000 mL via INTRAVENOUS

## 2019-12-02 MED ORDER — FAMOTIDINE 20 MG PO TABS *I*
40.0000 mg | ORAL_TABLET | Freq: Once | ORAL | Status: AC
Start: 2019-12-02 — End: 2019-12-02
  Administered 2019-12-02: 40 mg via ORAL
  Filled 2019-12-02: qty 2

## 2019-12-02 NOTE — ED Notes (Signed)
Pt verbalized understanding of discharge instructions. Pt ambulatory to discharge. Pt will pick up prescriptions at pharmacy, in appropriate clothing. IV removed by prior shift. Pt will pick up prescriptions at pharmacy.

## 2019-12-02 NOTE — Discharge Instructions (Addendum)
Take the medications as prescribed and you can also take over the counter maalox or tums as needed to help with the pain. Follow-up with your doctor in a few days. Return to the emergency department if you have worsening pain, fevers over 101 F, persistent vomiting, or any other concerns.

## 2019-12-02 NOTE — ED Triage Notes (Signed)
Notes pain across abdomen since Sunday. States had one episode rectal bleeding on Sunday. States has a hx. Diverticulitis, cholecystectomy.       Triage Note   Donna Bernard, RN

## 2019-12-02 NOTE — ED Provider Progress Notes (Addendum)
ED Provider Progress Note    Assumed care of patient from day team at 1500. Labs unremarkable. CT A/P negative. Pt's nausea has resolved with zofran. Pain remains despite multiple doses of dilaudid. Suspect gastritis. Pt is well-appearing with benign exam. Will give pepcd/maalox, d/c home with Rx and pcp f/u. Pt also requesting refill Rx of her hydroxyzine. Return precautions discussed. Patient expressed understanding; all questions answered.          Cora Daniels, MD, 12/02/2019, 4:28 PM     Cora Daniels, MD  12/02/19 1629       Cora Daniels, MD  12/02/19 [phone]

## 2019-12-02 NOTE — ED Provider Notes (Signed)
History     Chief Complaint   Patient presents with    Abdominal Pain     Wendy Combs is a 29 y.o. female complaining of abdominal pain for 2 days.  The patient denies any fevers or chills.  She has had no diarrhea or melena.  She has no other complaints.          Medical/Surgical/Family History     Past Medical History:   Diagnosis Date    Arthritis     Fibromyalgia         There is no problem list on file for this patient.           No past surgical history on file.  No family history on file.       Social History     Tobacco Use    Smoking status: Never Smoker    Smokeless tobacco: Never Used   Substance Use Topics    Alcohol use: Not Currently    Drug use: Not on file     Living Situation     Questions Responses    Patient lives with     Homeless     Caregiver for other family member     External Services     Employment     Domestic Violence Risk                 Review of Systems   Review of Systems   Constitutional: Negative for fever.   HENT: Negative for ear pain.    Eyes: Negative for pain.   Respiratory: Negative for shortness of breath.    Cardiovascular: Negative for chest pain.   Gastrointestinal: Positive for abdominal pain.   Genitourinary: Negative for flank pain.   Skin: Negative for wound.   Neurological: Negative for headaches.   Psychiatric/Behavioral: Negative for confusion.       Physical Exam     Triage Vitals  Triage Start: Start, (12/02/19 1022)   First Recorded BP: 147/89, Resp: 16, Temp: 35.8 C (96.4 F) Oxygen Therapy SpO2: 96 %, Heart Rate: 70, (12/02/19 1027)  .      Physical Exam  Vitals and nursing note reviewed.   Constitutional:       General: She is not in acute distress.     Appearance: She is well-developed. She is not diaphoretic.   HENT:      Head: Normocephalic and atraumatic.   Eyes:      Conjunctiva/sclera: Conjunctivae normal.      Pupils: Pupils are equal, round, and reactive to light.   Neck:      Trachea: No tracheal deviation.   Cardiovascular:      Rate  and Rhythm: Normal rate and regular rhythm.      Heart sounds: Normal heart sounds.   Pulmonary:      Effort: Pulmonary effort is normal.      Breath sounds: Normal breath sounds. No stridor.   Abdominal:      General: There is no distension.      Palpations: Abdomen is soft.      Comments: Epigastric tenderness without rebound or guarding   Musculoskeletal:         General: Normal range of motion.      Cervical back: Normal range of motion and neck supple.   Skin:     General: Skin is warm and dry.   Neurological:      Mental Status: She is alert and oriented to person, place,  and time.      Deep Tendon Reflexes: Reflexes are normal and symmetric.   Psychiatric:         Behavior: Behavior normal.         Thought Content: Thought content normal.         Judgment: Judgment normal.         Medical Decision Making   Patient seen by me on:  12/02/2019    Assessment:  29 year old female with 3 days of abdominal pain.    Differential diagnosis:  Gastritis, GERD, appendicitis    Plan:  CBC, BMP, CT abdomen and pelvis              Laureen Ochs, MD          Elliot Gault, MD  12/13/19 [phone]

## 2019-12-03 ENCOUNTER — Encounter: Payer: Self-pay | Admitting: Student in an Organized Health Care Education/Training Program

## 2019-12-03 ENCOUNTER — Emergency Department
Admission: EM | Admit: 2019-12-03 | Discharge: 2019-12-03 | Disposition: A | Discharge status: Home / Self Care | Payer: Medicaid Other | Source: Ambulatory Visit | Attending: Emergency Medicine | Admitting: Emergency Medicine

## 2019-12-03 DIAGNOSIS — K2970 Gastritis, unspecified, without bleeding: Secondary | ICD-10-CM | POA: Insufficient documentation

## 2019-12-03 DIAGNOSIS — R1011 Right upper quadrant pain: Secondary | ICD-10-CM

## 2019-12-03 DIAGNOSIS — R1013 Epigastric pain: Secondary | ICD-10-CM | POA: Insufficient documentation

## 2019-12-03 DIAGNOSIS — R000 Tachycardia, unspecified: Secondary | ICD-10-CM | POA: Insufficient documentation

## 2019-12-03 LAB — POCT URINE PREGNANCY: Preg Test,UR POC: NEGATIVE

## 2019-12-03 MED ORDER — DEXTROSE 5 % FLUSH FOR PUMPS *I*
0.0000 mL/h | INTRAVENOUS | Status: DC | PRN
Start: 2019-12-03 — End: 2019-12-03

## 2019-12-03 MED ORDER — PROMETHAZINE HCL 25 MG PO TABS *I*
25.0000 mg | ORAL_TABLET | Freq: Once | ORAL | Status: AC
Start: 2019-12-03 — End: 2019-12-03
  Administered 2019-12-03: 25 mg via ORAL
  Filled 2019-12-03: qty 1

## 2019-12-03 MED ORDER — SODIUM CHLORIDE 0.9 % FLUSH FOR PUMPS *I*
0.0000 mL/h | INTRAVENOUS | Status: DC | PRN
Start: 2019-12-03 — End: 2019-12-03

## 2019-12-03 MED ORDER — ALUM & MAG HYDROXIDE-SIMETH 200-200-20 MG/5ML PO SUSP *I*
30.0000 mL | Freq: Once | ORAL | Status: AC
Start: 2019-12-03 — End: 2019-12-03
  Administered 2019-12-03: 30 mL via ORAL
  Filled 2019-12-03: qty 30

## 2019-12-03 NOTE — Discharge Instructions (Signed)
You were seen today for concerns of abdominal pain.  Unfortunately, you appear to continue to be suffering from gastritis, which is inflammation of your stomach.  This will heal slowly.  Continue to take the medications were previously prescribed.  You can also take Tylenol to help with your discomfort.  I discouraged ibuprofen or other NSAIDs as it may worsen your problem.  Please attempt to drink fluids, eat a bland diet while you are recovering from this.    If you experience worsening abdominal pain, inability to tolerate oral fluids or any other symptoms you find concerning please return to the ED for further evaluation.

## 2019-12-03 NOTE — ED Provider Notes (Addendum)
History     Chief Complaint   Patient presents with    Abdominal Pain     29 year old female with past medical history of arthritis, fibromyalgia presents with abdominal pain.  CT abdomen and pelvis was done yesterday without evidence of abnormality.  The patient tells me however that she is continuing to have sharp epigastric pain that radiates into her back.  She has had a prior cholecystectomy.  She endorses nausea, vomiting and loose stool.  She tolerated some broth yesterday, has been struggling to tolerate p.o. today despite Zofran, Pepcid that was prescribed from the ED yesterday.          Medical/Surgical/Family History     Past Medical History:   Diagnosis Date    Arthritis     Fibromyalgia         There is no problem list on file for this patient.           History reviewed. No pertinent surgical history.  No family history on file.       Social History     Tobacco Use    Smoking status: Never Smoker    Smokeless tobacco: Never Used   Substance Use Topics    Alcohol use: Not Currently    Drug use: Not on file     Living Situation     Questions Responses    Patient lives with     Homeless     Caregiver for other family member     External Services     Employment     Domestic Violence Risk                 Review of Systems   Review of Systems   Constitutional: Negative for fever.   Respiratory: Negative for shortness of breath.    Cardiovascular: Negative for chest pain.   Gastrointestinal: Positive for abdominal pain, diarrhea, nausea and vomiting.   Skin: Negative for color change.   Hematological: Does not bruise/bleed easily.       Physical Exam     Triage Vitals  Triage Start: Start, (12/03/19 1028)   First Recorded BP: (!) 136/93, Resp: 18, Temp: 36.2 C (97.2 F), Temp src: TEMPORAL Oxygen Therapy SpO2: 98 %, Oximetry Source: Lt Hand, O2 Device: None (Room air), Heart Rate: 107, (12/03/19 1026)  .  First Pain Reported  0-10 Scale: 10, (12/03/19 1026)       Physical Exam  Vitals and nursing  note reviewed.   Constitutional:       General: She is not in acute distress.     Appearance: She is not toxic-appearing.   HENT:      Head: Normocephalic and atraumatic.   Eyes:      General: No scleral icterus.  Cardiovascular:      Rate and Rhythm: Regular rhythm. Tachycardia present.      Heart sounds: Normal heart sounds.   Pulmonary:      Effort: Pulmonary effort is normal. No respiratory distress.      Breath sounds: Normal breath sounds.   Abdominal:      General: Abdomen is flat.      Palpations: Abdomen is soft.      Tenderness: There is abdominal tenderness in the right upper quadrant and epigastric area.      Comments: Patient quite sensitive to light palpation in the epigastrium, right upper quadrant   Musculoskeletal:         General: No swelling or deformity.  Cervical back: Normal range of motion and neck supple.   Skin:     General: Skin is warm and dry.   Neurological:      Mental Status: She is alert and oriented to person, place, and time. Mental status is at baseline.   Psychiatric:         Mood and Affect: Mood normal.         Behavior: Behavior normal.         Medical Decision Making   Patient seen by me on:  12/03/2019    Assessment:  29 year old female with multiple ED visits this week we presents with ongoing epigastric pain.  Seems very consistent with gastritis.  Already has a negative CT abdomen pelvis, I would consider biliary pathology but she also has previously had her gallbladder out.  Ultimately will attempt to obtain better symptom control.  She is mildly tachycardic in the low 100s on presentation but appears nontoxic.    Differential diagnosis:  Gastritis, cyclic vomiting, gastroenteritis, dehydration  Low concern for pancreatitis, perforation, obstruction, or other emergent intra-abdominal process given her reassuring work-up last night    Plan:  Maalox  Phenergan  Reassessment  POCT pregnancy test      ED Course and Disposition:  Patient tolerating p.o. after Maalox,  Phenergan.  I discussed with her that this most likely is gastritis, encouraged her to eat a bland diet, take the antacid prescribed yesterday.  Discharge to home.            Wille Celeste, MD      Resident Attestation:    Patient seen by me on 12/03/2019.    I saw and evaluated the patient. I agree with the resident's/fellow's findings and plan of care as documented above.  Author:  Rosey Bath, MD       Wille Celeste, MD  Resident  12/03/19 1545       Contrina Orona, Clementeen Hoof, MD  12/04/19 [phone]

## 2019-12-03 NOTE — ED Triage Notes (Signed)
pt was seen yesterday for abd pain , pt was discharged , pt now reports her pain has gotten worse since she was discharged 10/10 now also having new upper back pain. Denies new injury or trauma.    Triage Note   Ashok Norris, RN

## 2019-12-11 ENCOUNTER — Other Ambulatory Visit: Payer: Self-pay

## 2020-02-16 ENCOUNTER — Encounter: Payer: Self-pay | Admitting: Family Medicine

## 2020-02-16 ENCOUNTER — Other Ambulatory Visit: Payer: Self-pay

## 2020-02-16 ENCOUNTER — Ambulatory Visit: Payer: Medicaid Other | Attending: Family Medicine | Admitting: Family Medicine

## 2020-02-16 ENCOUNTER — Other Ambulatory Visit
Admission: RE | Admit: 2020-02-16 | Discharge: 2020-02-16 | Disposition: A | Discharge status: Home / Self Care | Payer: Medicaid Other | Source: Ambulatory Visit | Attending: Family Medicine | Admitting: Family Medicine

## 2020-02-16 VITALS — BP 142/100 | HR 112 | Ht 64.0 in | Wt 233.9 lb

## 2020-02-16 DIAGNOSIS — R591 Generalized enlarged lymph nodes: Secondary | ICD-10-CM | POA: Insufficient documentation

## 2020-02-16 DIAGNOSIS — I10 Essential (primary) hypertension: Secondary | ICD-10-CM | POA: Insufficient documentation

## 2020-02-16 DIAGNOSIS — F32A Depression, unspecified: Secondary | ICD-10-CM | POA: Insufficient documentation

## 2020-02-16 DIAGNOSIS — M069 Rheumatoid arthritis, unspecified: Secondary | ICD-10-CM | POA: Insufficient documentation

## 2020-02-16 DIAGNOSIS — Z113 Encounter for screening for infections with a predominantly sexual mode of transmission: Secondary | ICD-10-CM

## 2020-02-16 DIAGNOSIS — K219 Gastro-esophageal reflux disease without esophagitis: Secondary | ICD-10-CM | POA: Insufficient documentation

## 2020-02-16 DIAGNOSIS — F411 Generalized anxiety disorder: Secondary | ICD-10-CM | POA: Insufficient documentation

## 2020-02-16 DIAGNOSIS — F410 Panic disorder [episodic paroxysmal anxiety] without agoraphobia: Secondary | ICD-10-CM | POA: Insufficient documentation

## 2020-02-16 DIAGNOSIS — J4520 Mild intermittent asthma, uncomplicated: Secondary | ICD-10-CM | POA: Insufficient documentation

## 2020-02-16 DIAGNOSIS — F331 Major depressive disorder, recurrent, moderate: Secondary | ICD-10-CM | POA: Insufficient documentation

## 2020-02-16 LAB — LIPID PANEL
Chol/HDL Ratio: 4
Cholesterol: 210 mg/dL — AB
HDL: 52 mg/dL (ref 40–60)
LDL Calculated: 137 mg/dL — AB
Non HDL Cholesterol: 158 mg/dL
Triglycerides: 106 mg/dL

## 2020-02-16 LAB — PCMH DEPRESSION ASSESSMENT

## 2020-02-16 LAB — COMPREHENSIVE METABOLIC PANEL
ALT: 15 U/L (ref 0–35)
AST: 17 U/L (ref 0–35)
Albumin: 4.3 g/dL (ref 3.5–5.2)
Alk Phos: 81 U/L (ref 35–105)
Anion Gap: 12 (ref 7–16)
Bilirubin,Total: 0.3 mg/dL (ref 0.0–1.2)
CO2: 22 mmol/L (ref 20–28)
Calcium: 9.2 mg/dL (ref 8.8–10.2)
Chloride: 104 mmol/L (ref 96–108)
Creatinine: 0.8 mg/dL (ref 0.51–0.95)
GFR,Black: 114 *
GFR,Caucasian: 99 *
Glucose: 89 mg/dL (ref 60–99)
Lab: 9 mg/dL (ref 6–20)
Potassium: 4.5 mmol/L (ref 3.3–5.1)
Sodium: 138 mmol/L (ref 133–145)
Total Protein: 6.8 g/dL (ref 6.3–7.7)

## 2020-02-16 LAB — TSH: TSH: 1.32 u[IU]/mL (ref 0.27–4.20)

## 2020-02-16 LAB — HEMOGLOBIN A1C: Hemoglobin A1C: 4.5 %

## 2020-02-16 MED ORDER — SUCRALFATE 1 GM PO TABS *I*
1.0000 g | ORAL_TABLET | Freq: Four times a day (QID) | ORAL | 1 refills | Status: AC
Start: 2020-02-16 — End: ?
  Filled 2020-02-16: qty 120, 30d supply, fill #0

## 2020-02-16 MED ORDER — QUETIAPINE FUMARATE 25 MG PO TABS *I*
25.0000 mg | ORAL_TABLET | Freq: Every evening | ORAL | 0 refills | Status: DC
Start: 2020-02-16 — End: 2020-05-10
  Filled 2020-02-16: qty 30, 30d supply, fill #0
  Filled 2020-04-02: qty 30, 30d supply, fill #1
  Filled 2020-05-06: qty 30, 30d supply, fill #2

## 2020-02-16 MED ORDER — TRIAMCINOLONE ACETONIDE 0.1 % EX OINT *I*
TOPICAL_OINTMENT | Freq: Two times a day (BID) | CUTANEOUS | 1 refills | Status: AC
Start: 2020-02-16 — End: ?
  Filled 2020-02-16: qty 30, 30d supply, fill #0

## 2020-02-16 MED ORDER — FLUOXETINE HCL 10 MG PO CAPS *I*
10.0000 mg | ORAL_CAPSULE | Freq: Every day | ORAL | 1 refills | Status: DC
Start: 2020-02-16 — End: 2020-05-10
  Filled 2020-02-16: qty 30, 30d supply, fill #0
  Filled 2020-04-02: qty 30, 30d supply, fill #1
  Filled 2020-05-06: qty 30, 30d supply, fill #2

## 2020-02-16 MED ORDER — HYDROXYZINE HCL 25 MG PO TABS *I*
25.0000 mg | ORAL_TABLET | Freq: Three times a day (TID) | ORAL | 1 refills | Status: DC | PRN
Start: 2020-02-16 — End: 2020-05-10
  Filled 2020-02-16: qty 90, 30d supply, fill #0
  Filled 2020-05-06: qty 90, 30d supply, fill #1

## 2020-02-16 MED ORDER — OMEPRAZOLE 40 MG PO CPDR *I*
40.0000 mg | DELAYED_RELEASE_CAPSULE | Freq: Every day | ORAL | 5 refills | Status: AC
Start: 2020-02-16 — End: 2020-08-14
  Filled 2020-02-16: qty 30, 30d supply, fill #0

## 2020-02-16 NOTE — Progress Notes (Signed)
[facility] New Patient Visit:    Subjective:    Wendy Combs is a 29 y.o. female presenting for New Patient Visit      HPI:    HTN: was taking amlodipine 5 mg daily.Hasn't taken in over a month.  Was told in the emergency department to stop the medication as her blood pressure was only mildly elevated.    Patient has multiple symptoms related to her anxiety and depression. She states that she feels anxious every day and will use marijuana about 4-5 times daily to help cope with anxiety. She also suffers panic attacks which are intermittent and random. During the panic attack she feels like she is going to die and that she cannot breathe and has chest pressure. She does have hydroxyzine 25 mg which helps minimally. She often wakes up around 4:30 AM and then cannot fall back asleep for the remainder of the morning. She feels exhausted all the time and her main complaint is fatigue. She also has upset stomach frequently with sometimes vomiting blood-tinged sputum and has blood in her stool. She states that she did have a stomach ulcer when she was in high school. She was taking omeprazole which was helpful. She has not taken that in years. She also has headaches which occur daily. They feel like pulsating and associated with photophobia. When the headaches occur she generally sleeps which is sometimes helpful. She also has nausea and vomiting associated with the headaches.    She also has rheumatoid arthritis and fibromyalgia. She states that she was seeing a rheumatologist back in [place] but has not seen one in a while. She states that her joints do intermittently get swollen and painful.    She is originally from [place] but moved down to [place] when she was about 7. She moved back from [place] about 1 year ago to be closer to her grandparents. Her grandfather passed away a few months ago. She does still take care of her grandmother. Patient currently lives by  herself.    She has a lymph node on the right side of her posterior neck that has been there for many years. She states that when she gets sick it gets bigger and painful.      Objective:  BP (!) 142/100    Pulse (!) 112    Ht 1.626 m (5\' 4" )    Wt 106.1 kg (233 lb 14.4 oz)    SpO2 100%    BMI 40.15 kg/m     Vitals reviewed  General: well-appearing, well-nourished, NAD   Eyes:  Conjunctiva clear, sclera anicteric  ENT: Hearing intact to conversational speech,  external ears normal in appearance  Neck: No thyromegaly or nodularity, 0.7 cm oval soft nodule along the right posterior neck  CV:  RRR, S1/S2 normal without m/r/g  Resp:  Breathing comfortably on RA, CTAB  Abd:  Soft, NT/ND, +BS throughout, no masses or hepatosplenomegaly  Neuro:  Alert, no gross focal motor deficits, fluent speech  MSK  Normal gait, no gross abnormalities or deformity  Psych:  casually dressed, affect full range/congruent with mood/appropriate ,  good eye contact, no motor tics, speech volume/rate/content appropriate   Skin/Ext:  WWP, no rashes or conspicuous lesions      Assessment/Plan:    1. Generalized anxiety disorder with panic attacks  --Uncontrolled. Start fluoxetine 10 mg daily for controller medication and continue hydroxyzine 25 mg 3 times daily as needed for panic. Also start quetiapine 25 mg  nightly as needed for sleep. Patient was also given a list to obtain a therapist.  - TSH; Future  - FLUoxetine (PROZAC) 10 MG capsule; Take 1 capsule (10 mg total) by mouth daily  Dispense: 90 capsule; Refill: 1  - QUEtiapine (SEROQUEL) 25 MG tablet; Take 1 tablet (25 mg total) by mouth nightly  Dispense: 90 tablet; Refill: 0  - hydrOXYzine HCl (ATARAX) 25 MG tablet; Take 1 tablet (25 mg total) by mouth 3 times daily as needed for Anxiety  Dispense: 90 tablet; Refill: 1    2. Moderate episode of recurrent major depressive disorder  --As above.    3. Essential hypertension  --Above goal. We will continue to monitor once patient's anxiety is  better under control. Will consider a beta-blocker if needed given her concurrent persistent tachycardia.  - Comprehensive metabolic panel; Future  - Hemoglobin A1c; Future  - Lipid Panel (Reflex to Direct  LDL if Triglycerides more than 400); Future    4. Rheumatoid arthritis, involving unspecified site, unspecified whether rheumatoid factor present  --Unclear how the patient was diagnosed. We will recheck rheumatoid factor.  - Rheumatoid factor,screen; Future    5. Mild intermittent asthma without complication  --Uses an inhaler intermittently. She avoids using the inhaler because it makes her jittery and anxious.    6. Screen for STD (sexually transmitted disease)  - N. Gonorrhoeae DNA amplification; Future  - Chlamydia plasmid DNA amplification; Future  - Trichomonas DNA amplification; Future  - HIV 1&2 antigen/antibody; Future  - Hepatitis C antibody; Future  - Syphilis Screen w Rfx to RPR and TPPA; Future  - Chlamydia plasmid DNA amplification  - N. Gonorrhoeae DNA amplification  - Trichomonas DNA amplification    7. Gastroesophageal reflux disease, unspecified whether esophagitis present  --Symptoms are concerning for peptic ulcer disease. We will start with a PPI and sucralfate for symptom management.  - omeprazole (PRILOSEC) 40 MG capsule; Take 1 capsule (40 mg total) by mouth daily  Dispense: 30 capsule; Refill: 5  - sucralfate (CARAFATE) 1 GM tablet; Take 1 tablet (1 g total) by mouth 4 times daily (before meals and nightly)  Dispense: 180 tablet; Refill: 1    8. Lymphadenopathy of head and neck  --Most likely a lymph node. Recommended that we check with an ultrasound to make sure that the lymph node appears normal.  - US soft tissue neck and or head; Future    Total time of care on the date of service was 66 minutes including pre- and post-visit work.    Follow up: Return in about 5 weeks (around 03/22/2020) for Follow up mood.      Carilyn Goodpasture, MD  [facility] Clinical Faculty, Department of [facility]  Medicine  [facility]  [address], [place], [place] [address]  Phone: [phone]  Fax: [phone]      To my patients:  Some of my notes are dictated using voice-recognition program which may result in minor transcription errors.  If you have any urgent concerns, please contact me through MyChart.  Please bring any non-urgent concerns to your next appointment so we can discuss them.  Thank you!

## 2020-02-16 NOTE — Patient Instructions (Addendum)
Start fluoxetine (Prozac) 10 mg daily. Can take either in the morning or at night.    Take omeprazole in the morning 30 minutes before other meds or food. Take sucralfate before meals and before bed (4 times daily) for stomach pain.    Take seroquel (quetiapine) 25 mg at night about one hour before bed for sleep as needed.      MENTAL HEALTH SERVICES / CONTACT INFORMATION     Website to help you find a therapist (you can also put in your insurance information and it will help you find a psychotherapist accepts your insurance)    [email or web address]        [facility]   [address]   [phone]   Walk in intake Mon-Thurs 9a-3p, Fri 9a-12p   Bring ID and Insurance Card   No referral needed         [facility]   [address]   [phone]   Walk in [place] and Thurs 8:30-9:30am   Bring ID and insurance card   No referral needed         [facility]   [address]   [phone] ext. [phone]   WALK IN HOURS: Tuesdays, Wednesdays, and Thursdays from 9 a.m. to 11 a.m.   Bring ID and Insurance card   No referral needed         [facility]   [address]   [phone]   Patient can call with insurance info   No referral needed         [facility]   [address]   [phone]   Patient can call with insurance info  No referral needed        [facility]              Adult Ambulatory  [phone]              Family Therapy [phone]              Child/Adolescent  [phone]        [facility]    [address] [place].     [phone]        [facility]    [address]    [phone]        [facility]    Jenelle Mages:   [address]  [phone]     [place]  [address]   [phone]           Apps to Help Manage Stress/Anxiety/Depression   (some may require purchase or subscription fees)      Bloom Daily interactive therapy  sessions  Guided CBT journaling exercises  100+ exercises based on cognitive behavioral therapy  Analysis of thoughts & emotions  Personal well-being tracker   Mindshift Uses scientifically proven strategies based on Cognitive Behavioural Therapy (CBT) to help you learn to relax and be mindful, develop more effective ways of thinking, and use active steps to take charge of your anxiety.  Get the tools to tackle:   Worry   Panic   Perfectionism   Social Anxiety   Phobias      CBT Thought Diary Thought Diary will help you evaluate, understand, and change your thoughts and feelings. By using this application, you can work to identify your emotions, analyze how and why you're feeling this way, challenge those negative beliefs, change  your thinking patterns for future situations, and remember positive experiences. You can use this app as a mood journal, a thought record journal, and a gratitude journal.     Headspace is your guide to everyday mindfulness in just a few minutes a day. Choose from hundreds of guided meditations on everything from managing stress and anxiety to sleep, productivity, exercise, and physical health -- including short SOS meditations for when you're on the go. And to help you find some space during tough times, Headspace is here for you with the new Weathering the storm pack.   Insight Timer Guided meditations, talks, and music to help reduce anxiety, manage stress, sleep deeply and improve happiness.  40,000 free services available - can filter by benefit (e.g. depression, sleep, etc.)   Stop, Breathe, and Think Allows you to check in with your emotions, and recommends short guided meditations, yoga and acupressure videos, tuned to how you feel    Developed by two clinical psychologists (creators of Moodnotes), MoodKit draws upon the principles and techniques of Cognitive Behavior Therapy (CBT), offering activities to help improve your mood and manage negative feelings related to  unhelpful thought patterns.    A library of 500+ guided meditations on topics ranging from anxiety to parenting to focus, with a separate section for sleep.   Breathe2Relax Portable stress management tool which provides detailed information on the effects of stress on the body and instructions and practice exercises to help users learn the stress management skill called diaphragmatic breathing.   What's Up Utilizes CBT (Cognitive Behavioral Therapy) and ACT (Acceptance Commitment Therapy) methods to help cope with depression, anxiety, anger, stress, etc.   Happify Activities based on positive psychology, mindfulness, and cognitive behavioral therapy (CBT) to help overcome negative thoughts and stress.

## 2020-02-17 ENCOUNTER — Telehealth: Payer: Self-pay | Admitting: Family Medicine

## 2020-02-17 LAB — CHLAMYDIA PLASMID DNA AMPLIFICATION: Chlamydia Plasmid DNA Amplification: 0

## 2020-02-17 LAB — N. GONORRHOEAE DNA AMPLIFICATION: N. gonorrhoeae DNA Amplification: 0

## 2020-02-17 LAB — TRICHOMONAS DNA AMPLIFICATION: Trichomonas DNA amplification: 0

## 2020-02-17 LAB — HIV 1&2 ANTIGEN/ANTIBODY: HIV 1&2 ANTIGEN/ANTIBODY: NONREACTIVE

## 2020-02-17 LAB — HEPATITIS C ANTIBODY: Hep C Ab: POSITIVE — AB

## 2020-02-17 LAB — SYPHILIS SCREEN
Syphilis Screen: NEGATIVE
Syphilis Status: NONREACTIVE

## 2020-02-17 LAB — RHEUMATOID FACTOR,SCREEN: Rheumatoid Factor: <10 IU/mL

## 2020-02-17 NOTE — Telephone Encounter (Signed)
Hep C can be a false positive or a past infection that has resolved. The lab automatically checks for active infection, and that test my take a few days to return. Will go over everything with her once that test comes back. So far nothing else concerning.

## 2020-02-17 NOTE — Telephone Encounter (Signed)
Patient read her most recent lab results and has questions for the doctor.  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  Please advise

## 2020-02-18 LAB — HEPATITIS C PCR QNT,SERUM
HCV PCR Qnt, ser: NOT DETECTED IU/mL
HCV PCR log, ser: NOT DETECTED IU/mL

## 2020-02-18 NOTE — Telephone Encounter (Signed)
Spoke to patient and relayed Dr Wilfred Curtis message. She verbalized understanding and had no additional questions or concerns at this time

## 2020-03-02 ENCOUNTER — Other Ambulatory Visit: Payer: Self-pay

## 2020-03-16 ENCOUNTER — Ambulatory Visit
Admission: AD | Admit: 2020-03-16 | Discharge: 2020-03-16 | Disposition: A | Discharge status: Other Acute Inpatient Hospital | Payer: Medicaid Other | Source: Ambulatory Visit | Attending: Family | Admitting: Family

## 2020-03-16 ENCOUNTER — Encounter: Payer: Self-pay | Admitting: Family

## 2020-03-16 DIAGNOSIS — Z3202 Encounter for pregnancy test, result negative: Secondary | ICD-10-CM | POA: Insufficient documentation

## 2020-03-16 DIAGNOSIS — R1031 Right lower quadrant pain: Secondary | ICD-10-CM | POA: Insufficient documentation

## 2020-03-16 LAB — POCT URINALYSIS DIPSTICK
Glucose,UA POCT: NORMAL mg/dL
Ketones,UA POCT: NEGATIVE mg/dL
Leuk Esterase,UA POCT: NEGATIVE
Lot #: 54510303
Nitrite,UA POCT: NEGATIVE
PH,UA POCT: 5 (ref 5–8)
Protein,UA POCT: NEGATIVE mg/dL
Specific gravity,UA POCT: 1.02 (ref 1.002–1.030)
Urobilinogen,UA: 1 mg/dL — AB

## 2020-03-16 LAB — POCT URINE PREGNANCY
Lot #: 166234
Preg Test,UR POC: NEGATIVE

## 2020-03-16 NOTE — Discharge Instructions (Signed)
Due to high concern for severe right lower quadrant abdominal pain for the past 4 days advised that patient seeks further evaluation / possible workup/monitoring at [facility] emergency Department.     Patient will be transported to the ED by private vehicle    Triage RN called and report was given    Advised patient to remain NPO until further instructed

## 2020-03-16 NOTE — ED Triage Notes (Signed)
Pt presents with right side abd pain that started 4 days ago is constant in nature and is getting worse. Pt states that her stomach also feels like it's "in a knot". No N/V, fever, diarrhea, melena, hematochazia.   Current state of emergency related to COVID-19. Documentation completed per hospital emergency response standard.         Triage Note   Ebbie Latus, RN

## 2020-03-16 NOTE — UC Provider Note (Signed)
History     Chief Complaint   Patient presents with    Abdominal Pain     Pt presents with right side abd pain that started 4 days ago is constant in nature and is getting worse. Pt states that her stomach also feels like it's "in a knot". No N/V, fever, diarrhea, melena, hematochazia.        History provided by:  Patient  Language interpreter used: No    Abdominal Pain  Pain location:  RLQ and RUQ  Pain quality: shooting and stabbing    Pain radiates to:  RUQ (Patient states that pain radiates from right lower quadrant up into her right upper quadrant and flank.)  Pain severity:  Severe  Onset quality:  Sudden  Duration: Patient states the pain started 4 days ago and has severely worsened in intensity over the past day.  Timing:  Constant  Progression:  Worsening  Chronicity:  New  Context: not alcohol use, not diet changes, not eating, not recent illness, not recent sexual activity, not sick contacts, not suspicious food intake and not trauma    Relieved by:  None tried  Worsened by:  Movement, position changes and palpation  Ineffective treatments:  None tried  Associated symptoms: fatigue    Associated symptoms: no anorexia, no belching, no chest pain, no chills, no constipation, no cough, no diarrhea, no dysuria, no fever, no flatus, no hematemesis, no hematochezia, no hematuria, no melena, no nausea, no shortness of breath, no vaginal bleeding, no vaginal discharge and no vomiting    Risk factors: obesity    Risk factors: has not had multiple surgeries, no NSAID use and not pregnant    Risk factors comment:  Patient is a she has a history of ovarian cyst.  Patient states that she has had a cholecystectomy in the past, denies appendectomy.    Current state of emergency related to COVID-19. Documentation completed per hospital emergency response standard.    Medical/Surgical/Family History     Past Medical History:   Diagnosis Date    Arthritis     Fibromyalgia         Patient Active Problem List    Diagnosis Code    Depression F32.A    Generalized anxiety disorder F41.1    Hypertension I10    Rheumatoid arthritis M06.9    Mild intermittent asthma without complication J45.20    Gastroesophageal reflux disease, unspecified whether esophagitis present K21.9    Lymphadenopathy of head and neck R59.1            History reviewed. No pertinent surgical history.  Family History   Problem Relation Age of Onset    Hypertension Mother         Tachycardia, TIA    Hypertension Father         "Kidney problems"     Other Sister         thyroid and sarcoma     Hypertension Brother           Social History     Tobacco Use    Smoking status: Never Smoker    Smokeless tobacco: Never Used   Substance Use Topics    Alcohol use: Yes    Drug use: Yes     Types: Marijuana     Living Situation     Questions Responses    Patient lives with     Homeless     Caregiver for other family member     External  Services     Employment     Domestic Violence Risk                 Review of Systems   Review of Systems   Constitutional: Positive for fatigue. Negative for chills and fever.   Respiratory: Negative for cough and shortness of breath.    Cardiovascular: Negative for chest pain.   Gastrointestinal: Positive for abdominal pain. Negative for anorexia, blood in stool, constipation, diarrhea, flatus, hematemesis, hematochezia, melena, nausea and vomiting.   Genitourinary: Negative for decreased urine volume, difficulty urinating, dysuria, flank pain, frequency, hematuria, pelvic pain, vaginal bleeding, vaginal discharge and vaginal pain.   Skin: Negative for pallor and rash.       Physical Exam   Triage Vitals  Triage Start: Start, (03/16/20 1923)   First Recorded BP: 139/88, Resp: 18, Temp: 36 C (96.8 F), Temp src: TEMPORAL Oxygen Therapy SpO2: 100 %, O2 Device: None (Room air), Heart Rate: (!) 111, (03/16/20 1925)  .      Physical Exam  Vitals and nursing note reviewed.   Constitutional:       Appearance: Normal  appearance. She is well-developed.   HENT:      Head: Normocephalic.      Right Ear: Hearing normal.      Left Ear: Hearing normal.      Nose: Nose normal.   Eyes:      Conjunctiva/sclera: Conjunctivae normal.   Cardiovascular:      Rate and Rhythm: Normal rate and regular rhythm.      Heart sounds: Normal heart sounds.   Pulmonary:      Effort: Pulmonary effort is normal.      Breath sounds: Normal breath sounds.   Abdominal:      General: Bowel sounds are normal.      Palpations: Abdomen is soft.      Tenderness: There is abdominal tenderness in the right upper quadrant and right lower quadrant. There is guarding.          Comments: Severe pain with palpation of right lower quadrant that radiates into her right upper quadrant.  Abdomen is soft and nondistended without palpable masses or definitive rigidity.  No CVA tenderness bilaterally.   Musculoskeletal:         General: Normal range of motion.      Cervical back: Normal range of motion.   Skin:     General: Skin is warm and dry.   Neurological:      Mental Status: She is alert and oriented to person, place, and time.   Psychiatric:         Mood and Affect: Mood normal.         Behavior: Behavior is cooperative.         Thought Content: Thought content normal.          Medical Decision Making        Medical Decision Making  Assessment:    Arika Mainer is a 29 y.o. female who presents with chief complaint of severe right lower quadrant abdominal pain radiating into her right upper quadrant for the past 4 days, which was addressed today.  Patient was alert and oriented, vital signs and nursing triage note reviewed. Patient did not appear toxic.    Differential diagnosis:    Appendicitis  Diverticulitis/diverticulosis  Nephrolithiasis  Pyelonephritis  Urinary Retention  Cystitis/UTI  Infectious Colitis  Constipation  Gatroenteritis  Gastritis  GERD  Pregnancy  Ovarian Cyst Rupture  Ovarian Torsion    Plan and Results:    Reviewed patient's allergies, history  summarized, problem list, home medication list.    Urinalysis ordered and personally reviewed by me, result was remarkable for urobilinogen, bilirubin and trace blood.  UPT negative. See final result summary if available.  Encounter orders    Orders Placed This Encounter      POCT urine pregnancy      POCT urinalysis dipstick    Lab results   Recent Results (from the past 24 hour(s))  -POCT urinalysis dipstick:   Collection Time: 03/16/20  7:37 PM       Result                                            Value                                                    Specific gravity,UA POCT                          1.020                                                    PH,UA POCT                                        5.0                                                      Leuk Esterase,UA POCT                             Negative                                                 Nitrite,UA POCT                                   Negative                                                 Protein,UA POCT                                   Negative  Glucose,UA POCT                                   Normal                                                   Ketones,UA POCT                                   Negative                                                 Urobilinogen,UA                                   1 mg/dL (!)                                              Bilirubin,Ur                                      + (!)                                                    Blood,UA POCT                                     Trace (!)                                                Exp date                                          10/22                                                    Lot #                                             [id number]                                            -  POCT urine pregnancy:   Collection Time: 03/16/20  7:39 PM       Result                                             Value                                                    Preg Test,UR POC                                  Negative                                                 INTERNAL CONTROL POCT URINE PREGNANCY             *Yes-internal procedural control(s) acceptable           Exp date                                          9/22                                                     Lot #                                             [id number]                                                Independent Review of: existing labs/imaging and chart/prior records    Diagnosis and Disposition:    In discussing patient history, severity of symptoms with right lower quadrant abdominal pain it was recommended patient be transferred to the emergency department for further evaluation and management at this time.  Patient states understanding and is agreeable to discharge.    Return precautions discussed and provided on AVS.    Follow-up   Follow-up Information     Mariana Single, MD.    Specialty: Family Medicine  Contact information:  [address]  [place] [address]  [phone]             [facility].    Specialty: Emergency Medicine  Why: As needed, If symptoms worsen  Contact information:  [address]  [place] [address]  [phone]                   Patient Instructions   .       Due to high  concern for severe right lower quadrant abdominal pain for the   past 4 days advised that patient seeks further evaluation / possible   workup/monitoring at [facility] emergency Department.     Patient will be transported to the ED by private vehicle    Triage RN called and report was given    Advised patient to remain NPO until further instructed      Final Diagnosis  Final diagnoses:   [R10.31] Right lower quadrant abdominal pain (Primary)         Vinnie Langton, NP              Vinnie Langton, NP  03/16/20 2009

## 2020-03-17 ENCOUNTER — Emergency Department: Payer: Medicaid Other

## 2020-03-17 ENCOUNTER — Encounter: Payer: Self-pay | Admitting: Emergency Medicine

## 2020-03-17 ENCOUNTER — Emergency Department
Admission: EM | Admit: 2020-03-17 | Discharge: 2020-03-18 | Disposition: A | Discharge status: Home / Self Care | Payer: Medicaid Other | Source: Ambulatory Visit | Attending: Emergency Medicine | Admitting: Emergency Medicine

## 2020-03-17 DIAGNOSIS — R110 Nausea: Secondary | ICD-10-CM

## 2020-03-17 DIAGNOSIS — R109 Unspecified abdominal pain: Secondary | ICD-10-CM | POA: Insufficient documentation

## 2020-03-17 DIAGNOSIS — R1031 Right lower quadrant pain: Secondary | ICD-10-CM

## 2020-03-17 LAB — POTASSIUM

## 2020-03-17 LAB — RUQ PANEL (ED ONLY)
ALT: 16 U/L (ref 0–35)
Albumin: 4.3 g/dL (ref 3.5–5.2)
Alk Phos: 90 U/L (ref 35–105)
Amylase: 95 U/L (ref 28–100)
Bilirubin,Direct: <0.2 mg/dL (ref 0.0–0.3)
Bilirubin,Total: 0.4 mg/dL (ref 0.0–1.2)
Lipase: 47 U/L (ref 13–60)
Total Protein: 7.1 g/dL (ref 6.3–7.7)

## 2020-03-17 LAB — CBC AND DIFFERENTIAL
Baso # K/uL: 0.1 10*3/uL (ref 0.0–0.1)
Basophil %: 1.1 %
Eos # K/uL: 0.3 10*3/uL (ref 0.0–0.4)
Eosinophil %: 6.2 %
Hematocrit: 38 % (ref 34–45)
Hemoglobin: 12.3 g/dL (ref 11.2–15.7)
IMM Granulocytes #: 0 10*3/uL (ref 0.0–0.0)
IMM Granulocytes: 0.2 %
Lymph # K/uL: 1.3 10*3/uL (ref 1.2–3.7)
Lymphocyte %: 25.2 %
MCH: 29 pg (ref 26–32)
MCHC: 33 g/dL (ref 32–36)
MCV: 90 fL (ref 79–95)
Mono # K/uL: 0.5 10*3/uL (ref 0.2–0.9)
Monocyte %: 10.2 %
Neut # K/uL: 3 10*3/uL (ref 1.6–6.1)
Nucl RBC # K/uL: 0 10*3/uL (ref 0.0–0.0)
Nucl RBC %: 0 /100 WBC (ref 0.0–0.2)
Platelets: 251 10*3/uL (ref 160–370)
RBC: 4.2 MIL/uL (ref 3.9–5.2)
RDW: 12.4 % (ref 11.7–14.4)
Seg Neut %: 57.1 %
WBC: 5.3 10*3/uL (ref 4.0–10.0)

## 2020-03-17 LAB — BASIC METABOLIC PANEL
Anion Gap: 11 (ref 7–16)
CO2: 22 mmol/L (ref 20–28)
Calcium: 8.9 mg/dL (ref 8.8–10.2)
Chloride: 106 mmol/L (ref 96–108)
Creatinine: 0.81 mg/dL (ref 0.51–0.95)
GFR,Black: 113 *
GFR,Caucasian: 98 *
Glucose: 86 mg/dL (ref 60–99)
Lab: 6 mg/dL (ref 6–20)
Sodium: 139 mmol/L (ref 133–145)

## 2020-03-17 LAB — AST

## 2020-03-17 LAB — PREGNANCY TEST, SERUM: Preg,Serum: NEGATIVE

## 2020-03-17 MED ORDER — DEXTROSE 5 % FLUSH FOR PUMPS *I*
0.0000 mL/h | INTRAVENOUS | Status: DC | PRN
Start: 2020-03-17 — End: 2020-03-18

## 2020-03-17 MED ORDER — SODIUM CHLORIDE 0.9 % 25 ML IV SOLN *I*
12.5000 mg | Freq: Once | INTRAVENOUS | Status: AC
Start: 2020-03-18 — End: 2020-03-18
  Administered 2020-03-17: 12.5 mg via INTRAVENOUS
  Filled 2020-03-17: qty 1

## 2020-03-17 MED ORDER — KETOROLAC TROMETHAMINE 30 MG/ML IJ SOLN *I*
15.0000 mg | Freq: Once | INTRAMUSCULAR | Status: AC
Start: 2020-03-17 — End: 2020-03-17
  Administered 2020-03-17: 15 mg via INTRAVENOUS
  Filled 2020-03-17: qty 1

## 2020-03-17 MED ORDER — SODIUM CHLORIDE 0.9 % IV BOLUS *I*
1000.0000 mL | Freq: Once | Status: AC
Start: 2020-03-17 — End: 2020-03-17
  Administered 2020-03-17: 1000 mL via INTRAVENOUS

## 2020-03-17 MED ORDER — IOHEXOL 350 MG/ML (OMNIPAQUE) IV SOLN *I*
1.0000 mL | Freq: Once | INTRAVENOUS | Status: AC
Start: 2020-03-17 — End: 2020-03-17
  Administered 2020-03-17: 145 mL via INTRAVENOUS

## 2020-03-17 MED ORDER — HYDROMORPHONE HCL PF 1 MG/ML IJ SOLN *WRAPPED*
0.5000 mg | Freq: Once | INTRAMUSCULAR | Status: AC | PRN
Start: 2020-03-17 — End: 2020-03-17
  Administered 2020-03-17: 0.5 mg via INTRAVENOUS
  Filled 2020-03-17: qty 0.5

## 2020-03-17 MED ORDER — SODIUM CHLORIDE 0.9 % FLUSH FOR PUMPS *I*
0.0000 mL/h | INTRAVENOUS | Status: DC | PRN
Start: 2020-03-17 — End: 2020-03-18

## 2020-03-17 MED ORDER — ONDANSETRON HCL 2 MG/ML IV SOLN *I*
4.0000 mg | Freq: Once | INTRAMUSCULAR | Status: AC
Start: 2020-03-17 — End: 2020-03-17
  Administered 2020-03-17: 4 mg via INTRAVENOUS
  Filled 2020-03-17: qty 2

## 2020-03-17 NOTE — ED Triage Notes (Signed)
Pt presents to the ED c/o RLQ pain and nausea x 5 days. She denies dysuria, emesis, and PMH appendectomy.       Triage Note   Otto Herb, RN

## 2020-03-17 NOTE — ED Provider Notes (Signed)
History     Chief Complaint   Patient presents with    Abdominal Pain     Wendy Combs is a 29 y.o. female with PMH significant for Fibromyalgia, GERD, HTN, GERD Anxiety, Depression who presents with right sided abdominal pain x 4 days. Reports worsening pain today. She was seen at the UC yesterday, was supposed to come to the ED for further evaluation but didn't come yesterday. Reports nausea, mild watery diarrhea at baseline d/t meds, denies emesis, vaginal bleeding, vaginal discharge, fever, chills, CP, SOB or any other concerns at this time        History provided by:  Patient  Language interpreter used: No      Medical/Surgical/Family History     Past Medical History:   Diagnosis Date    Arthritis     Fibromyalgia         Patient Active Problem List   Diagnosis Code    Depression F32.A    Generalized anxiety disorder F41.1    Hypertension I10    Rheumatoid arthritis M06.9    Mild intermittent asthma without complication J45.20    Gastroesophageal reflux disease, unspecified whether esophagitis present K21.9    Lymphadenopathy of head and neck R59.1            Past Surgical History:   Procedure Laterality Date    CHOLECYSTECTOMY  2016     Family History   Problem Relation Age of Onset    Hypertension Mother         Tachycardia, TIA    Hypertension Father         "Kidney problems"     Other Sister         thyroid and sarcoma     Hypertension Brother           Social History     Tobacco Use    Smoking status: Never Smoker    Smokeless tobacco: Never Used   Substance Use Topics    Alcohol use: Yes    Drug use: Yes     Types: Marijuana     Living Situation     Questions Responses    Patient lives with     Homeless     Caregiver for other family member     External Services     Employment     Domestic Violence Risk                 Review of Systems   Review of Systems   Constitutional: Negative for chills and fever.   Respiratory: Negative for shortness of breath.    Cardiovascular:  Negative for chest pain.   Gastrointestinal: Positive for abdominal pain and nausea. Negative for vomiting.   All other systems reviewed and are negative.      Physical Exam     Triage Vitals  Triage Start: Start, (03/17/20 1638)   First Recorded BP: 152/89, Resp: 18, Temp: 36.1 C (97 F) Oxygen Therapy SpO2: 100 %, O2 Device: None (Room air), Heart Rate: 108, (03/17/20 1639)  .  First Pain Reported  0-10 Scale: 10, Pain Location/Orientation: Abdomen, (03/17/20 1639)       Physical Exam  Vitals and nursing note reviewed.   Constitutional:       General: She is not in acute distress.     Appearance: She is well-developed. She is not ill-appearing, toxic-appearing or diaphoretic.   HENT:      Head: Normocephalic and atraumatic.   Cardiovascular:  Rate and Rhythm: Normal rate and regular rhythm.   Pulmonary:      Effort: Pulmonary effort is normal.      Breath sounds: Normal breath sounds.   Abdominal:      General: Bowel sounds are normal.      Palpations: Abdomen is soft.      Tenderness: Tenderness: right sided. There is no guarding or rebound.   Musculoskeletal:         General: Normal range of motion.      Cervical back: Normal range of motion and neck supple.   Skin:     General: Skin is warm and dry.      Capillary Refill: Capillary refill takes less than 2 seconds.   Neurological:      Mental Status: She is alert and oriented to person, place, and time.         Medical Decision Making   Patient seen by me on:  03/17/2020    Assessment:  Wendy Combs is a 29 y.o. female with PMH significant for Fibromyalgia, GERD, HTN, GERD Anxiety, Depression who presents with right sided abdominal pain x 4 days. Vitals HDS. Labs unremarkable, nontoxic appearing    Differential diagnosis:  Colitis  Appendicitis  Viral syndrome/COVID-19  Hepatobiliary disease  Gastroenteritis       Plan:    Orders Placed This Encounter      CT abdomen and pelvis with contrast      CBC and differential      Basic metabolic panel      RUQ  panel (ED only)      Pregnancy Test, Serum      CBC and differential      Potassium      AST      Initiate droplet & contact isolation with eye protection      POCT urinalysis dipstick      Insert peripheral IV    Medications  HYDROmorphone PF (DILAUDID) injection 0.5 mg (has no administration in time range)  sodium chloride 0.9 % bolus 1,000 mL (0 mLs Intravenous Stopped 03/17/20 2149)  ketorolac (TORADOL) 30 mg/mL injection 15 mg (15 mg Intravenous Given 03/17/20 2034)  ondansetron (ZOFRAN) injection 4 mg (4 mg Intravenous Given 03/17/20 2034)  ketorolac (TORADOL) 30 mg/mL injection 15 mg (15 mg Intravenous Given 03/17/20 2204)    This patient was signed out to provider  Peter Minium, NP at 11:08 PM.  Awaiting the following: CT.  Disposition as per data results and patient clinical status.        Independent review of: Existing labs, chart/prior records      Labs Reviewed   CBC AND DIFFERENTIAL   BASIC METABOLIC PANEL   RUQ PANEL (ED ONLY)   PREGNANCY TEST, SERUM   CBC AND DIFFERENTIAL   POTASSIUM   AST   POCT URINALYSIS DIPSTICK     CT abdomen and pelvis with contrast    (Results Pending)               Bonnee Quin, PA          [place], [place], [place]  03/17/20 2309

## 2020-03-17 NOTE — First Provider Contact (Signed)
ED First Provider Contact Note    Initial provider evaluation performed by me on 03/17/20 at 4:40 pm    Vital signs reviewed.    Assessment: 29 year old female here with right-sided abdominal pain for 4 days.  The worst pain is in the right upper quadrant.  She is status post cholecystectomy.  Went to urgent care yesterday, referred to the ED.      hCG negative at urgent care yesterday      Orders placed:    LABS     Patient requires further evaluation.     Timoteo Ace, MD, 03/17/2020, 4:40 PM     Timoteo Ace, MD  03/17/20 [phone]

## 2020-03-17 NOTE — ED Notes (Signed)
Pt states that pain is a 10/10 after receiving Toradol. States that pain is a radiating towards her back states that it is "bringing her to tears". Will continue to monitor

## 2020-03-17 NOTE — ED Notes (Signed)
Pt presents to the ED w/ c/o R. Sided abd pain for the past 4 days. Pt denies any vomited or diarrhea. Pt reports she is feeling nauseas. Pt reports no change in urinary pattern. Pt endorses hx of gall-bladder removal. Pt A&Ox4 in bed.

## 2020-03-18 MED ORDER — HYDROMORPHONE HCL PF 1 MG/ML IJ SOLN *WRAPPED*
1.0000 mg | Freq: Once | INTRAMUSCULAR | Status: AC
Start: 2020-03-18 — End: 2020-03-18
  Administered 2020-03-18: 1 mg via INTRAVENOUS
  Filled 2020-03-18: qty 1

## 2020-03-18 MED ORDER — ONDANSETRON HCL 2 MG/ML IV SOLN *I*
4.0000 mg | Freq: Once | INTRAMUSCULAR | Status: AC
Start: 2020-03-18 — End: 2020-03-18
  Administered 2020-03-18: 4 mg via INTRAVENOUS
  Filled 2020-03-18: qty 2

## 2020-03-18 NOTE — Discharge Instructions (Signed)
Lab work and CT scan reveal no acute findings  Follow up with your PCP

## 2020-03-18 NOTE — ED Provider Progress Notes (Signed)
ED Provider Progress Note    Received patient in sign out from the evening team pending CT abd/pelvis.      Labs Reviewed   CBC AND DIFFERENTIAL   BASIC METABOLIC PANEL   RUQ PANEL (ED ONLY)   PREGNANCY TEST, SERUM   CBC AND DIFFERENTIAL   POTASSIUM   AST   POCT URINALYSIS DIPSTICK     CT abdomen and pelvis with contrast   Preliminary Result      No acute findings in the abdomen and pelvis.      END OF IMPRESSION        CT and labs normal.  Stable for discharge home with PCP follow up.  ED return precautions provided.            Leata Mouse, NP, 03/18/2020, 12:01 AM     Leata Mouse, NP  03/18/20 0003

## 2020-03-30 ENCOUNTER — Ambulatory Visit: Payer: Self-pay | Admitting: Family Medicine

## 2020-04-02 ENCOUNTER — Other Ambulatory Visit: Payer: Self-pay

## 2020-04-06 ENCOUNTER — Telehealth: Payer: Self-pay | Admitting: Family Medicine

## 2020-04-06 NOTE — Telephone Encounter (Signed)
L/m to call the office. Please ask if there any other symptoms if I am not available when call is returned

## 2020-04-06 NOTE — Telephone Encounter (Signed)
Pt complains of back and stomach pain for a week now.  Patient is scheduled to see Dr Foy [place] on Thursday, 11/18 but was wondering if she could be seen sooner.  Joni Reining scheduled is full. Please advise

## 2020-04-06 NOTE — Telephone Encounter (Signed)
I am not here tomorrow either so she will have to wait until Thursday. If pain is severe, she can go to an urgent care or ED for evaluation. She can try Tums in case the pain is related to acid reflux. What other symptoms is she having?

## 2020-04-07 ENCOUNTER — Emergency Department
Admission: EM | Admit: 2020-04-07 | Discharge: 2020-04-07 | Discharge status: Against Medical Advice | Payer: Medicaid Other | Source: Ambulatory Visit | Attending: Emergency Medicine | Admitting: Emergency Medicine

## 2020-04-07 DIAGNOSIS — R000 Tachycardia, unspecified: Secondary | ICD-10-CM

## 2020-04-07 DIAGNOSIS — Z5321 Procedure and treatment not carried out due to patient leaving prior to being seen by health care provider: Secondary | ICD-10-CM | POA: Insufficient documentation

## 2020-04-07 MED ORDER — SODIUM CHLORIDE 0.9 % FLUSH FOR PUMPS *I*
0.0000 mL/h | INTRAVENOUS | Status: DC | PRN
Start: 2020-04-07 — End: 2020-04-07

## 2020-04-07 MED ORDER — DEXTROSE 5 % FLUSH FOR PUMPS *I*
0.0000 mL/h | INTRAVENOUS | Status: DC | PRN
Start: 2020-04-07 — End: 2020-04-07

## 2020-04-07 NOTE — ED Triage Notes (Signed)
Pt to ED with mid upper abdominal pain that radiates into back. Per pt pain started Sunday night. Pt states she takes sucralfate, omeprazole daily. Endorses nausea.             Prehospital medications given: No

## 2020-04-07 NOTE — Telephone Encounter (Signed)
Patient went to [facility] ED this morning.

## 2020-04-08 ENCOUNTER — Ambulatory Visit: Payer: Self-pay | Admitting: Family Medicine

## 2020-04-12 ENCOUNTER — Ambulatory Visit: Payer: Self-pay | Admitting: Family Medicine

## 2020-04-13 ENCOUNTER — Encounter: Payer: Self-pay | Admitting: Family Medicine

## 2020-05-05 ENCOUNTER — Other Ambulatory Visit: Payer: Self-pay

## 2020-05-06 ENCOUNTER — Other Ambulatory Visit: Payer: Self-pay

## 2020-05-10 ENCOUNTER — Other Ambulatory Visit: Payer: Self-pay | Admitting: Family Medicine

## 2020-05-10 DIAGNOSIS — F411 Generalized anxiety disorder: Secondary | ICD-10-CM

## 2020-05-10 DIAGNOSIS — F410 Panic disorder [episodic paroxysmal anxiety] without agoraphobia: Secondary | ICD-10-CM

## 2020-05-10 MED ORDER — FLUOXETINE HCL 10 MG PO CAPS *I*
10.0000 mg | ORAL_CAPSULE | Freq: Every day | ORAL | 0 refills | Status: DC
Start: 2020-05-10 — End: 2020-08-12
  Filled 2020-05-12: qty 30, 30d supply, fill #0

## 2020-05-10 MED ORDER — QUETIAPINE FUMARATE 25 MG PO TABS *I*
25.0000 mg | ORAL_TABLET | Freq: Every evening | ORAL | 0 refills | Status: DC
Start: 2020-05-10 — End: 2020-08-12
  Filled 2020-05-12: qty 30, 30d supply, fill #0

## 2020-05-10 MED ORDER — HYDROXYZINE HCL 25 MG PO TABS *I*
25.0000 mg | ORAL_TABLET | Freq: Three times a day (TID) | ORAL | 0 refills | Status: DC | PRN
Start: 2020-05-10 — End: 2020-08-12
  Filled 2020-05-12: qty 90, 30d supply, fill #0

## 2020-05-10 NOTE — Telephone Encounter (Signed)
Last visit: 02/16/20  Next visit: [place]   Patient is out of town and has been without medication for 3 days .Patient left medications pend at  home by accident rushing. She needs medications sent to [facility] in [place] .[place] [address].Pharmacy has been added

## 2020-05-12 ENCOUNTER — Other Ambulatory Visit: Payer: Self-pay | Admitting: Family Medicine

## 2020-05-12 ENCOUNTER — Other Ambulatory Visit: Payer: Self-pay

## 2020-05-12 DIAGNOSIS — F411 Generalized anxiety disorder: Secondary | ICD-10-CM

## 2020-05-12 DIAGNOSIS — F410 Panic disorder [episodic paroxysmal anxiety] without agoraphobia: Secondary | ICD-10-CM

## 2020-08-12 ENCOUNTER — Other Ambulatory Visit: Payer: Self-pay | Admitting: Family Medicine

## 2020-08-12 DIAGNOSIS — F410 Panic disorder [episodic paroxysmal anxiety] without agoraphobia: Secondary | ICD-10-CM

## 2020-08-12 DIAGNOSIS — F411 Generalized anxiety disorder: Secondary | ICD-10-CM

## 2020-08-12 NOTE — Telephone Encounter (Signed)
Patient requesting 90 day supply     Last visit 02/16/2020    Next visit  Nothing scheduled  - patient moved to [place], does not have a PCP.  She is in [place] today

## 2020-08-13 ENCOUNTER — Other Ambulatory Visit: Payer: Self-pay

## 2020-08-13 MED ORDER — HYDROXYZINE HCL 25 MG PO TABS *I*
25.0000 mg | ORAL_TABLET | Freq: Three times a day (TID) | ORAL | 0 refills | Status: AC | PRN
Start: 2020-08-13 — End: ?
  Filled 2020-08-13: qty 90, 30d supply, fill #0

## 2020-08-13 MED ORDER — FLUOXETINE HCL 10 MG PO CAPS *I*
10.0000 mg | ORAL_CAPSULE | Freq: Every day | ORAL | 0 refills | Status: AC
Start: 2020-08-13 — End: ?
  Filled 2020-08-13: qty 30, 30d supply, fill #0

## 2020-08-13 MED ORDER — QUETIAPINE FUMARATE 25 MG PO TABS *I*
25.0000 mg | ORAL_TABLET | Freq: Every evening | ORAL | 0 refills | Status: AC
Start: 2020-08-13 — End: ?
  Filled 2020-08-13: qty 30, 30d supply, fill #0

## 2020-10-25 ENCOUNTER — Other Ambulatory Visit: Payer: Self-pay

## 2020-10-26 ENCOUNTER — Other Ambulatory Visit: Payer: Self-pay

## 8387-01-21 DEATH — deceased
